# Patient Record
Sex: Female | Born: 1985 | Race: White | Hispanic: Yes | Marital: Married | Smoking: Never smoker
Health system: Southern US, Community
[De-identification: ages and names within clinical notes are randomized; demographics above are authoritative.]

## PROBLEM LIST (undated history)

## (undated) ENCOUNTER — Inpatient Hospital Stay (HOSPITAL_COMMUNITY): Payer: Self-pay

---
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
## 2006-07-10 ENCOUNTER — Ambulatory Visit: Payer: Self-pay | Admitting: Family Medicine

## 2007-02-25 ENCOUNTER — Ambulatory Visit (HOSPITAL_COMMUNITY): Admission: RE | Admit: 2007-02-25 | Discharge: 2007-02-25 | Payer: Self-pay | Admitting: Family Medicine

## 2007-03-19 ENCOUNTER — Ambulatory Visit (HOSPITAL_COMMUNITY): Admission: RE | Admit: 2007-03-19 | Discharge: 2007-03-19 | Payer: Self-pay | Admitting: Obstetrics & Gynecology

## 2007-06-23 ENCOUNTER — Ambulatory Visit: Payer: Self-pay | Admitting: Physician Assistant

## 2007-06-23 ENCOUNTER — Inpatient Hospital Stay (HOSPITAL_COMMUNITY): Admission: AD | Admit: 2007-06-23 | Discharge: 2007-06-25 | Payer: Self-pay | Admitting: Obstetrics & Gynecology

## 2008-01-04 LAB — SICKLE CELL SCREEN: Sickle Cell Screen: NEGATIVE

## 2008-01-04 LAB — OB RESULTS CONSOLE VARICELLA ZOSTER ANTIBODY, IGG: Varicella: IMMUNE

## 2010-06-18 NOTE — Group Therapy Note (Signed)
NAME:  Courtney Franco, Courtney Franco NO.:  [id number]   MEDICAL RECORD NO.:  [id number]          PATIENT TYPE:  WOC   LOCATION:  [facility]                   FACILITY:  [facility]   PHYSICIAN:  Tinnie Gens, MD        DATE OF BIRTH:  03/16/1985   DATE OF SERVICE:                                  CLINIC NOTE   CHIEF COMPLAINT:  Infertility.   HISTORY OF PRESENT ILLNESS:  Patient is a 25 year old G0 who presents to  the [facility] after 2 years of trying unsuccessfully to conceive.  Patient states she and her husband are having intercourse 3 times a  week, but are waiting 2 weeks until after period ends.  Patient states  that prior to January 2008, she was having regular periods every 2  months, and now her periods occur anywhere from 5 to 8 weeks apart.  Her  last menstrual occurred on May 10, 2006.  Patient took a urine  pregnancy test 2 to 3 weeks ago, which was negative.  Patient states  that both of her sisters took a pill to get pregnant.  Patient also  indicates that she has pain in her left side with every period, and was  told by her mother-in-law that she has inflammation.   PAST MEDICAL HISTORY:  Patient is negative for sexually transmitted  infections, PID, or prior pregnancies.  Menstrual history:  Patient  began menstruating at age 49.  She states her period lasts for 3 days.  As discussed above, her cycles occur every 1 to 2 months.  The rest of  her medical history is negative.   FAMILY HISTORY:  Significant for 2 sisters who also had difficulty  conceiving, and significant for diabetes in her grandmother, and  hypertension in her mother.   SOCIAL HISTORY:  Patient lives with her husband, Rolan Bucco, and  has been living in the [place] for the last 7 years.  Prior to  that, she was living in [place].  Patient denies tobacco, alcohol, or  drugs.   PHYSICAL EXAMINATION:  Patient is afebrile.  Vital signs stable.  GENERAL:  Patient is well developed,  well nourished, in no acute  distress.  Alert and cooperative throughout exam.  BIMANUAL EXAM:  Patient has folliculitis secondary to shaving.  Otherwise, normal external genitalia.  Vaginal mucosa is pink and  appropriately moist.  Patient has no cervical motion tenderness.  She  has a small retroverted uterus.  She has no adnexal tenderness, nor  masses appreciated.   ASSESSMENT AND PLAN:  1. Patient is a 25 year old woman with infertility.  Reviewed timing      of ovulation with patient, and instructed her to count the 1st day      of menstrual period as day 1 rather than the last day of her      menstrual period, and that if she counting 2 weeks, she is to count      from the 1st day of her menstrual period.  Patient expressed      understanding of this.  2. Patient was given a prescription for her partner to  have a semen      analysis.  3. Patient was given a basal body temperature chart, and an      explanation of how to use this.  4. Patient will have HSG scheduled following onset of her next      menstrual cycle.  5. Patient was advised to continue intercourse every other day around      the time of ovulation.  6. Patient is to return to the office in 6 to 8 weeks for infertility      followup.           ______________________________  Tinnie Gens, MD     TP/MEDQ  D:  07/10/2006  T:  07/10/2006  Job:  [id number]

## 2010-10-30 LAB — CBC
HCT: 36.3
Platelets: 158
RDW: 14

## 2010-10-30 LAB — RPR: RPR Ser Ql: NONREACTIVE

## 2013-11-04 ENCOUNTER — Ambulatory Visit (INDEPENDENT_AMBULATORY_CARE_PROVIDER_SITE_OTHER): Payer: Self-pay | Admitting: Gynecology

## 2013-11-04 ENCOUNTER — Encounter: Payer: Self-pay | Admitting: Gynecology

## 2013-11-04 VITALS — BP 124/80 | Ht 63.0 in | Wt 159.0 lb

## 2013-11-04 DIAGNOSIS — N915 Oligomenorrhea, unspecified: Secondary | ICD-10-CM | POA: Insufficient documentation

## 2013-11-04 DIAGNOSIS — N970 Female infertility associated with anovulation: Secondary | ICD-10-CM | POA: Insufficient documentation

## 2013-11-04 DIAGNOSIS — E663 Overweight: Secondary | ICD-10-CM

## 2013-11-04 LAB — PREGNANCY, URINE: PREG TEST UR: NEGATIVE

## 2013-11-04 MED ORDER — MEDROXYPROGESTERONE ACETATE 10 MG PO TABS: ORAL_TABLET | ORAL | Status: DC

## 2013-11-04 MED ORDER — CLOMIPHENE CITRATE 50 MG PO TABS: ORAL_TABLET | ORAL | Status: DC

## 2013-11-04 NOTE — Progress Notes (Signed)
   Patient is a 28 year old gravida 1 para 1 who has not been seen in the office in over 6 years. This was the time that patient had seen me because of oligomenorrhea and primary infertility. She conceived on the first round of clomiphene citrate 100 mg from day 5 through 9. The patient denies any visual disturbances or headaches or nipple discharge. Patient stated her last menstrual period was September 2. She started taking prenatal vitamins 2 months ago. She stated that in [facility] where she currently lives had a gynecological exam Pap smear less than a year ago. Patient denies smoking or substance abuse. No past history of sexually transmitted diseases. Patient currently on no medication otherwise healthy. She is slightly overweight with a BMI of 28.17. Patient's last pregnancy was delivered near term vaginally with no complication. Patient did not have any hypertension diabetes or any complications with that pregnancy.  Exam: Abdomen: Soft nontender no rebound or guarding Pelvic: The urethra Skene was within normal limits Vagina: No lesions or discharge Cervix: No lesions or discharge Uterus: Anteverted normal size shape and consistency Adnexa: No palpable mass or tenderness Rectal exam not done  Urine pregnancy test was negative today in the office.  Assessment/plan: Patient with oligomenorrhea and secondary infertility. Patient successfully conceived 6 years ago on clomiphene citrate and timing of intercourse. Patient will be prescribed Provera 10 mg to take 1 by mouth daily for the next 5-10 days to jump start her menses. She will then begin the clomiphene citrate 100 mg from day 5 through 9. She will use the ovulation predictor kit from day 12-16 to time or intercourse. I have given him refills for 3 months. If she does not successfully conceived during that time frame she will contact the office for an appointment. Patient is fully aware that this form of ovulation induction  carries a risk ovarian hyperstimulation and multiple gestation. Her husband was present also during the consultation they fully understand and accept. Instructions were provided in Spanish and ligature information provided as well. All questions answered.

## 2013-11-04 NOTE — Patient Instructions (Addendum)
Tomar una tableta de Provera diaria para que le Chubb Corporationbaje el periodo. No tiene que tomar las diez si le baja el periodo  El primer dia que sangras es el primer dia del ciclo suyo  Del dia 5 al 9 tomar clomid dos tabletas juntas  Del North DakotaDia 12-16 chequiar la orina con Armed forces operational officerel detector de ovulaccion por la manana y por la noche. Si nota el cambio en en indicador tiene Medical illustratorque tener relaccion sexual esa noche y Wells Fargoproxima dos noche  Si no le baja el periodo en 30 dias despues del ultimo periodo Psychiatric nursechequiar prueba de embarazo si esta negativo repetir la secuencia que le describi arriba.  Tomar una vitamina prenatal diario.  La recetas son por 4 meses  Clomiphene tablets Qu es este medicamento? El CLOMIFENO es un medicamento para la fertilidad que se Coc[place] ([place]) Islandsutiliza para aumentar la posibilidad de Burundiquedar embarazada. Se Botswanausa para estimular Hotel manageruna ovulacin (producir un huevo maduro) adecuada durante el ciclo de la [place]mujer. Este medicamento puede ser utilizado para otros usos; si tiene alguna pregunta consulte con su proveedor de atencin mdica o con su farmacutico. MARCAS COMERCIALES DISPONIBLES: Clomid, Serophene Qu le debo informar a mi profesional de la salud antes de tomar este medicamento? Necesita saber si usted presenta alguno de los siguientes problemas o situaciones: -enfermedad de la glndula suprarrenal -enfermedad vascular, trastorno de coagulacin sangunea -quiste en los ovarios -endometriosis -enfermedad heptica -carcinoma de ovario -enfermedad de la glndula pituitaria -sangrado vaginal que no ha sido evaluado -una reaccin alrgica o inusual al clomifeno, a otros medicamentos, alimentos, colorantes o conservantes -si est embarazada (no debe usar si est embarazada) -si est amamantando a un beb Cmo debo utilizar este medicamento? Tome este medicamento por va oral con un vaso de agua. Siga las instrucciones de la etiqueta del South Monroemedicamento. Tomar exactamente segn se indica y [address] el nmero exacto  de [address]  [address]das para los que fue recetado. Tome sus dosis a intervalos regulares. La Harley-Davidsonmayora de las mujeres toman este medicamento durante un perodo de 5 Tonkawadas, Biomedical engineerpero la duracin del tratamiento puede ajustarse en algunos casos. Su mdico le Field seismologistindicarn el da en que debe empezar a tomar este medicamento y Engineer, sitele darn las indicaciones para Animatorel seguimiento. No tome su medicamento con una frecuencia mayor que la indicada. Hable con su pediatra para informarse acerca del uso de este medicamento en nios. Puede requerir atencin especial. Sobredosis: Pngase en contacto inmediatamente con un centro toxicolgico o una sala de urgencia si usted cree que haya tomado demasiado medicamento. ATENCIN: Reynolds AmericanEste medicamento es solo para usted. No comparta este medicamento con nadie. Qu sucede si me olvido de una dosis? Si olvida una dosis, tmela lo antes posible. Si es casi la hora de la prxima dosis, tome slo esa dosis. No tome dosis adicionales o dobles. Qu puede interactuar con este medicamento? -suplementos a base de hierbas o dietticos como cohosh azul, cohosh negro, vitex o DHEA -prasterona Puede ser que esta lista no menciona todas las posibles interacciones. Informe a su profesional de Beazer Homesla salud de Ingram Micro Inctodos los productos a base de hierbas, medicamentos de Farsonventa libre o suplementos nutritivos que est tomando. Si usted fuma, consume bebidas alcohlicas o si utiliza drogas ilegales, indqueselo tambin a su profesional de Beazer Homesla salud. Algunas sustancias pueden interactuar con su medicamento. A qu debo estar atento al usar PPL Corporationeste medicamento? Asegrese que usted sepa cmo y cundo usar PPL Corporationeste medicamento. Debe saber cundo est ovulando y cundo debe Washington Mutualtener relaciones sexuales a fin de incrementar las posibilidades de quedar Halfwayembarazada.  Visite a su mdico o a su profesional de la salud para chequear su evolucin peridicamente. Es posible que deba controlar sus niveles de hormonas en la sangre o que le indique alguna prueba de  orina domiciliaria para Scientist, physiological ovulacin. Trate de no faltar a las citas. En comparacin con otros tratamientos para la fertilidad, este medicamento no aumenta mucho la posibilidad de Warehouse manager un Medical illustrator. Aproximadamente 5 de cada 100 mujeres que toman este medicamento tienen la posibilidad de quedar embarazadas con [employer]. Si piensa que est embarazada deje de tomar este medicamento de inmediato y comunquese con su mdico o con su profesional de Radiographer, therapeutic. Este medicamento no es para tratamientos a Air cabin crew. La Harley-Davidson de las mujeres que se benefician del uso de este medicamento obtienen resultados dentro de los tres primeros ciclos (meses). Su mdico o su profesional de Facilities manager a Financial controller. Este medicamento generalmente se utiliza durante no ms de 6 ciclos de [place]. Puede experimentar mareos o somnolencia. No conduzca ni utilice maquinaria ni haga nada que Scientist, research (life sciences) en estado de alerta hasta que sepa cmo le afecta este medicamento. No se siente ni se ponga de pie con rapidez. Esto reduce el riesgo de mareos o Newell Rubbermaid. El consumir bebidas alcohlicas o el fumar tabaco puede disminuir las posibilidades de [place]. Limitar o dejar de consumir alcohol o el uso de tabaco durante su tratamiento para la fertilidad. Qu efectos secundarios puedo tener al Boston Scientific este medicamento? Efectos secundarios que debe informar a su mdico o a Producer, television/film/video de la salud tan pronto como sea posible: -Therapist, art como erupcin cutnea, picazn o urticarias, hinchazn de la cara, labios o lengua -problemas respiratorios -cambios en la visin -retencin de lquidos -nuseas, vmito -hinchazn o dolor plvico -dolor abdominal severo -aumento de peso repentino Efectos secundarios que, por lo general, no requieren atencin mdica (debe informarlos a su mdico o a su profesional de la salud si persisten o si son molestos): -molestia en las  mamas -sofocos -molestia plvica leve -nuseas leves Puede ser que esta lista no menciona todos los posibles efectos secundarios. Comunquese a su mdico por asesoramiento mdico Hewlett-Packard. Usted puede informar los efectos secundarios a la FDA por telfono al 1-800-FDA-1088. Dnde debo guardar mi medicina? Mantngala fuera del alcance de los nios. Gurdela a Sanmina-SCI, entre 15 y 30 grados C (76 y 75 grados F). Protjala del calor, la luz y la humedad. Deseche todo el medicamento que no haya utilizado, despus de la fecha de vencimiento. ATENCIN: Este folleto es un resumen. Puede ser que no cubra toda la posible informacin. Si usted tiene preguntas acerca de esta medicina, consulte con su mdico, su farmacutico o su profesional de Radiographer, therapeutic.  2015, Elsevier/Gold Standard. (2006-07-07 11:32:00)

## 2013-11-05 LAB — TSH: TSH: 2.23 u[IU]/mL (ref 0.350–4.500)

## 2013-11-05 LAB — PROLACTIN: PROLACTIN: 14 ng/mL

## 2013-12-05 ENCOUNTER — Encounter: Payer: Self-pay | Admitting: Gynecology

## 2014-07-06 DIAGNOSIS — O42919 Preterm premature rupture of membranes, unspecified as to length of time between rupture and onset of labor, unspecified trimester: Secondary | ICD-10-CM

## 2016-02-04 NOTE — L&D Delivery Note (Signed)
Patient is 31 y.o. [id number] 2335w1d admitted for SOL. S/p augmentation with Pitocin. SROM prior to delivery.  Delivery Note At 8:27 AM a viable female was delivered via VBAC, Spontaneous (Presentation: LOA; compound hand).  APGAR: 9, 9; weight pending.   Placenta status: Intact.  Cord: 3V with the following complications: None.  Cord pH: N/A  Anesthesia: Epidural  Episiotomy: None Lacerations: 1st degree Suture Repair: 3.0 vicryl Est. Blood Loss (mL): 100  Head delivered LOA with compound hand. No nuchal cord present. Shoulder and body delivered in usual fashion. Infant with spontaneous cry, placed on mother's abdomen, dried and bulb suctioned. Cord clamped x 2 after 1-minute delay, and cut by family member. Cord blood drawn. Placenta delivered spontaneously with gentle cord traction. Fundus firm with massage and Pitocin. Perineum inspected and found to have laceration, which was repaired with good hemostasis achieved.   Mom to postpartum.  Baby to Couplet care / Skin to Skin.   Caryl AdaJazma Phelps, DO OB Fellow

## 2016-06-18 ENCOUNTER — Encounter: Payer: Self-pay | Admitting: Gynecology

## 2016-07-24 LAB — OB RESULTS CONSOLE PLATELET COUNT: PLATELETS: 190

## 2016-07-24 LAB — CYTOLOGY - PAP: PAP SMEAR: NEGATIVE

## 2016-07-24 LAB — OB RESULTS CONSOLE HEPATITIS B SURFACE ANTIGEN: Hepatitis B Surface Ag: NEGATIVE

## 2016-07-24 LAB — OB RESULTS CONSOLE GC/CHLAMYDIA
CHLAMYDIA, DNA PROBE: NEGATIVE
GC PROBE AMP, GENITAL: NEGATIVE

## 2016-07-24 LAB — OB RESULTS CONSOLE HIV ANTIBODY (ROUTINE TESTING): HIV: NONREACTIVE

## 2016-07-24 LAB — OB RESULTS CONSOLE ABO/RH: RH TYPE: POSITIVE

## 2016-07-24 LAB — OB RESULTS CONSOLE HGB/HCT, BLOOD
HEMATOCRIT: 39
HEMOGLOBIN: 12.5

## 2016-07-24 LAB — OB RESULTS CONSOLE ANTIBODY SCREEN: Antibody Screen: NEGATIVE

## 2016-07-24 LAB — OB RESULTS CONSOLE RUBELLA ANTIBODY, IGM: RUBELLA: IMMUNE

## 2016-07-24 LAB — GLUCOSE TOLERANCE, 1 HOUR: Glucose, 1 Hour GTT: 108

## 2016-07-24 LAB — CYSTIC FIBROSIS DIAGNOSTIC STUDY: Interpretation-CFDNA:: NEGATIVE

## 2016-07-24 LAB — OB RESULTS CONSOLE RPR: RPR: NONREACTIVE

## 2016-08-21 ENCOUNTER — Encounter: Payer: Self-pay | Admitting: *Deleted

## 2016-08-25 ENCOUNTER — Ambulatory Visit (INDEPENDENT_AMBULATORY_CARE_PROVIDER_SITE_OTHER): Payer: Medicaid Other | Admitting: Obstetrics and Gynecology

## 2016-08-25 ENCOUNTER — Encounter: Payer: Self-pay | Admitting: Obstetrics and Gynecology

## 2016-08-25 VITALS — BP 103/53 | HR 83 | Wt 163.9 lb

## 2016-08-25 DIAGNOSIS — Z603 Acculturation difficulty: Secondary | ICD-10-CM | POA: Insufficient documentation

## 2016-08-25 DIAGNOSIS — O09899 Supervision of other high risk pregnancies, unspecified trimester: Secondary | ICD-10-CM

## 2016-08-25 DIAGNOSIS — Z8659 Personal history of other mental and behavioral disorders: Secondary | ICD-10-CM | POA: Insufficient documentation

## 2016-08-25 DIAGNOSIS — O09212 Supervision of pregnancy with history of pre-term labor, second trimester: Secondary | ICD-10-CM

## 2016-08-25 DIAGNOSIS — O9989 Other specified diseases and conditions complicating pregnancy, childbirth and the puerperium: Secondary | ICD-10-CM

## 2016-08-25 DIAGNOSIS — O09219 Supervision of pregnancy with history of pre-term labor, unspecified trimester: Secondary | ICD-10-CM

## 2016-08-25 DIAGNOSIS — Z98891 History of uterine scar from previous surgery: Secondary | ICD-10-CM

## 2016-08-25 DIAGNOSIS — O99891 Other specified diseases and conditions complicating pregnancy: Secondary | ICD-10-CM | POA: Insufficient documentation

## 2016-08-25 DIAGNOSIS — O34219 Maternal care for unspecified type scar from previous cesarean delivery: Secondary | ICD-10-CM

## 2016-08-25 DIAGNOSIS — O0992 Supervision of high risk pregnancy, unspecified, second trimester: Secondary | ICD-10-CM | POA: Diagnosis present

## 2016-08-25 DIAGNOSIS — Z789 Other specified health status: Secondary | ICD-10-CM

## 2016-08-25 LAB — POCT URINALYSIS DIP (DEVICE)
BILIRUBIN URINE: NEGATIVE
GLUCOSE, UA: NEGATIVE mg/dL
Hgb urine dipstick: NEGATIVE
KETONES UR: NEGATIVE mg/dL
NITRITE: NEGATIVE
PH: 6 (ref 5.0–8.0)
PROTEIN: NEGATIVE mg/dL
Specific Gravity, Urine: 1.025 (ref 1.005–1.030)
Urobilinogen, UA: 0.2 mg/dL (ref 0.0–1.0)

## 2016-08-25 NOTE — Progress Notes (Signed)
New OB Note  08/25/2016   Clinic: [facility]  Chief Complaint: transfer of care  Transfer of Care Patient: [facility] for h/o PTB  History of Present Illness: Ms. Dake is a 31 y.o. [id number] @ 20/2 weeks (EDC 12/8, based on Patient's last menstrual period was 04/05/2016.=18wk u/s).  Preg complicated by has Oligomenorrhea; Secondary anovulatory infertility; History of cesarean delivery; History of preterm delivery, currently pregnant; and History of postpartum depression, currently pregnant in second trimester on her problem list.   Any events prior to today's visit: no She was using no method when she conceived and nothing to help conceive She has Negative signs or symptoms of miscarriage or preterm labor  ROS: A 12-point review of systems was performed and negative, except as stated in the above HPI.  OBGYN History: As per HPI. OB History  Gravida Para Term Preterm AB Living  3 2 1 1  0 3  SAB TAB Ectopic Multiple Live Births  0 0 0 1 3    # Outcome Date GA Lbr Len/2nd Weight Sex Delivery Anes PTL Lv  3 Current           2A Preterm 07/06/14 [redacted]w[redacted]d   F CS-Unspec EPI  LIV     Complications: Preterm premature rupture of membranes (PPROM) delivered, current hospitalization  2B Preterm 07/06/14 [redacted]w[redacted]d   F CS-Unspec EPI  LIV     Complications: Preterm premature rupture of membranes (PPROM) delivered, current hospitalization  1 Term 06/23/07 [redacted]w[redacted]d  7 lb 7 oz (3.374 kg) F Vag-Spont  N LIV     Birth Comments: triplet pregnancy, but two embryos "dissolved"      Any issues with any prior pregnancies: yes Prior children are healthy, doing well, and without any problems or issues: yes History of pap smears: Yes. Last pap smear 2018 and results were NILM   Past Medical History: History reviewed. No pertinent past medical history.  Past Surgical History: Past Surgical History:  Procedure Laterality Date  . CESAREAN SECTION      Family History:  Family History   Problem Relation Age of Onset  . Diabetes Maternal Grandmother   . Diabetes Maternal Grandfather   . Diabetes Paternal Grandmother   . Diabetes Paternal Grandfather   . Heart disease Sister    She denies any history of mental retardation, birth defects or genetic disorders in her or the FOB's history  Social History:  Social History   Social History  . Marital status: Married    Spouse name: N/A  . Number of children: N/A  . Years of education: N/A   Occupational History  . Not on file.   Social History Main Topics  . Smoking status: Never Smoker  . Smokeless tobacco: Never Used  . Alcohol use No  . Drug use: Unknown  . Sexual activity: Yes   Other Topics Concern  . Not on file   Social History Narrative  . No narrative on file    Allergy: No Known Allergies  Health Maintenance:  Mammogram Up to Date: not applicable  Current Outpatient Medications: PNV  Physical Exam:   BP (!) 103/53   Pulse 83   Wt 163 lb 14.4 oz (74.3 kg)   LMP 04/05/2016   BMI 29.03 kg/m  Body mass index is 29.03 kg/m. Contractions: Not present Vag. Bleeding: None. Fundal height: not applicable FHTs: 140s  General appearance: Well nourished, well developed female in no acute distress.   Laboratory: See media but all labs for  this point in pregnancy are UTD  Imaging:  7/16: SLIUP, normal anatomy 18/0, [facility]  Assessment: pt doing well  Plan: 1. History of cesarean delivery D/w her that she is a TOLAC candidate. Will d/w her later in pregnancy.   2. History of preterm delivery, currently pregnant Was 34wks with PPROM di-di twins. Given this, no need for any other interventions. Patient had normal TAUS cx length at the HD but did show 4% efw. Will just repeat one here at [facility] with MFM. NTD since far from viability.   3. History of postpartum depression, currently pregnant in second trimester Follow closely.   4. Pregnancy F/u re-draw of  quad screen at HD which was done last week per patient  Interpreter used   Problem list reviewed and updated.  Follow up in 4 weeks.  >50% of 25 min visit spent on counseling and coordination of care.     Cornelia Copaharlie Ruthanna Macchia, Jr. MD Attending [facility](Faculty Practice)

## 2016-08-26 ENCOUNTER — Telehealth: Payer: Self-pay | Admitting: General Practice

## 2016-08-26 NOTE — Telephone Encounter (Signed)
Scheduled appt for 8/9 @ 1015. Called patient with pacific interpreter [phone], no answer- left message to call us back concerning an appt we have scheduled for you. Will send letter

## 2016-08-26 NOTE — Telephone Encounter (Signed)
-----   Message from Watrous Bingharlie Pickens, MD sent at 08/25/2016 10:23 AM EDT ----- Regarding: needs mfm anatomy u/s sometime in next 14d Can you let her know that based final review of u/s that we got from the HD today that I recommend [facility] since [facility] weren't able to see everything on the HD u/s. Thanks!

## 2016-09-11 ENCOUNTER — Ambulatory Visit (HOSPITAL_COMMUNITY)
Admission: RE | Admit: 2016-09-11 | Discharge: 2016-09-11 | Disposition: A | Discharge status: Home / Self Care | Payer: Self-pay | Source: Ambulatory Visit | Attending: Obstetrics and Gynecology | Admitting: Obstetrics and Gynecology

## 2016-09-11 ENCOUNTER — Other Ambulatory Visit: Payer: Self-pay | Admitting: Obstetrics and Gynecology

## 2016-09-11 DIAGNOSIS — O09892 Supervision of other high risk pregnancies, second trimester: Secondary | ICD-10-CM

## 2016-09-11 DIAGNOSIS — O34219 Maternal care for unspecified type scar from previous cesarean delivery: Secondary | ICD-10-CM

## 2016-09-11 DIAGNOSIS — Z3A21 21 weeks gestation of pregnancy: Secondary | ICD-10-CM

## 2016-09-11 DIAGNOSIS — Z3689 Encounter for other specified antenatal screening: Secondary | ICD-10-CM | POA: Insufficient documentation

## 2016-09-11 DIAGNOSIS — O09212 Supervision of pregnancy with history of pre-term labor, second trimester: Secondary | ICD-10-CM

## 2016-09-11 DIAGNOSIS — O0992 Supervision of high risk pregnancy, unspecified, second trimester: Secondary | ICD-10-CM

## 2016-09-16 ENCOUNTER — Encounter: Payer: Self-pay | Admitting: Obstetrics and Gynecology

## 2016-09-16 DIAGNOSIS — O0990 Supervision of high risk pregnancy, unspecified, unspecified trimester: Secondary | ICD-10-CM | POA: Insufficient documentation

## 2016-09-22 ENCOUNTER — Ambulatory Visit (INDEPENDENT_AMBULATORY_CARE_PROVIDER_SITE_OTHER): Payer: Self-pay | Admitting: Family Medicine

## 2016-09-22 ENCOUNTER — Encounter: Payer: Self-pay | Admitting: Family Medicine

## 2016-09-22 VITALS — BP 101/39 | HR 80 | Wt 173.3 lb

## 2016-09-22 DIAGNOSIS — O09899 Supervision of other high risk pregnancies, unspecified trimester: Secondary | ICD-10-CM

## 2016-09-22 DIAGNOSIS — Z3689 Encounter for other specified antenatal screening: Secondary | ICD-10-CM

## 2016-09-22 DIAGNOSIS — Z98891 History of uterine scar from previous surgery: Secondary | ICD-10-CM

## 2016-09-22 DIAGNOSIS — O34219 Maternal care for unspecified type scar from previous cesarean delivery: Secondary | ICD-10-CM

## 2016-09-22 DIAGNOSIS — O9989 Other specified diseases and conditions complicating pregnancy, childbirth and the puerperium: Secondary | ICD-10-CM

## 2016-09-22 DIAGNOSIS — O09219 Supervision of pregnancy with history of pre-term labor, unspecified trimester: Secondary | ICD-10-CM

## 2016-09-22 DIAGNOSIS — O09212 Supervision of pregnancy with history of pre-term labor, second trimester: Secondary | ICD-10-CM

## 2016-09-22 DIAGNOSIS — O0990 Supervision of high risk pregnancy, unspecified, unspecified trimester: Secondary | ICD-10-CM

## 2016-09-22 DIAGNOSIS — Z8659 Personal history of other mental and behavioral disorders: Secondary | ICD-10-CM

## 2016-09-22 DIAGNOSIS — O0992 Supervision of high risk pregnancy, unspecified, second trimester: Secondary | ICD-10-CM

## 2016-09-22 MED ORDER — PRENATAL VITAMINS 0.8 MG PO TABS: 1.0000 | ORAL_TABLET | Freq: Every day | ORAL | 12 refills | Status: DC

## 2016-09-22 NOTE — Progress Notes (Signed)
   PRENATAL VISIT NOTE  Subjective:  Courtney Franco is a 31 y.o. [id number] at [redacted]w[redacted]d with Estimated Date of Delivery: 01/19/17 (by 18-week U/S) being seen today for ongoing prenatal care.  She is currently monitored for the following issues for this high-risk pregnancy and has Secondary anovulatory infertility; History of cesarean delivery; History of preterm delivery, currently pregnant; History of postpartum depression, currently pregnant in second trimester; Language barrier; and Supervision of high risk pregnancy in second trimester on her problem list.  Patient reports no complaints.  Contractions: Not present. Vag. Bleeding: None.  Movement: Present. Denies leaking of fluid.   The following portions of the patient's history were reviewed and updated as appropriate: allergies, current medications, past family history, past medical history, past social history, past surgical history and problem list. Problem list updated.  Objective:   Vitals:   09/22/16 0755  BP: (!) 101/39  Pulse: 80  Weight: 173 lb 4.8 oz (78.6 kg)    Fetal Status: Fetal Heart Rate (bpm): 128 Fundal Height: 23 cm Movement: Present     General:  Alert, oriented and cooperative. Patient is in no acute distress.  Skin: Skin is warm and dry. No rash noted.   Cardiovascular: Normal heart rate noted  Respiratory: Normal respiratory effort, no problems with respiration noted  Abdomen: Soft, gravid, appropriate for gestational age.  Pain/Pressure: Absent     Pelvic: Cervical exam deferred        Extremities: Normal range of motion.  Edema: None  Mental Status:  Normal mood and affect. Normal behavior. Normal judgment and thought content.   Assessment and Plan:  Pregnancy: [id number] at [redacted]w[redacted]d with Estimated Date of Delivery: 01/19/17 by 18-week U/S.  1. Supervision of high risk pregnancy, antepartum - Prenatal Multivit-Min-Fe-FA (PRENATAL VITAMINS) 0.8 MG tablet; Take 1 tablet by mouth daily.  Dispense: 30 tablet;  Refill: 12 - U/S 09/11/16 reviewed. Normal interval anatomy. EFW 27th. Follow-up U/S recommended in 4-6 weeks for growth and confirm EDC   --Scheduled U/S for 10/21/16 - Continue routine PNC   2. History of preterm delivery, currently pregnant:  34-week with PPROM di-di twins - No need for further interventions per Dr. Vergie Living note  4. History of cesarean delivery - Interested in [facility]. Will needs to consent for VBAC later in pregnancy  5. History of postpartum depression, currently pregnant in second trimester - Monitor closely   Preterm labor symptoms and general obstetric precautions including but not limited to vaginal bleeding, contractions, leaking of fluid and fetal movement were reviewed in detail with the patient. Please refer to After Visit Summary for other counseling recommendations.  Return in about 4 weeks (around 10/20/2016) for return OB/GTT.   Raynelle Fanning P. Tyia Binford, MD OB Fellow   Future Appointments Date Time Provider Department Center  10/20/2016 7:40 AM Adam Phenix, MD [facility]  10/21/2016 8:00 AM [facility] [place] 3 [facility]

## 2016-10-20 ENCOUNTER — Ambulatory Visit (INDEPENDENT_AMBULATORY_CARE_PROVIDER_SITE_OTHER): Payer: Self-pay | Admitting: Obstetrics & Gynecology

## 2016-10-20 ENCOUNTER — Encounter: Payer: Self-pay | Admitting: Obstetrics & Gynecology

## 2016-10-20 VITALS — BP 100/63 | HR 81 | Wt 179.4 lb

## 2016-10-20 DIAGNOSIS — O0993 Supervision of high risk pregnancy, unspecified, third trimester: Secondary | ICD-10-CM

## 2016-10-20 DIAGNOSIS — O0990 Supervision of high risk pregnancy, unspecified, unspecified trimester: Secondary | ICD-10-CM

## 2016-10-20 DIAGNOSIS — Z23 Encounter for immunization: Secondary | ICD-10-CM

## 2016-10-20 NOTE — Patient Instructions (Signed)
Third Trimester of Pregnancy The third trimester is from week 28 through week 40 (months 7 through 9). The third trimester is a time when the unborn baby (fetus) is growing rapidly. At the end of the ninth month, the fetus is about 20 inches in length and weighs 6-10 pounds. Body changes during your third trimester Your body will continue to go through many changes during pregnancy. The changes vary from woman to woman. During the third trimester:  Your weight will continue to increase. You can expect to gain 25-35 pounds (11-16 kg) by the end of the pregnancy.  You may begin to get stretch marks on your hips, abdomen, and breasts.  You may urinate more often because the fetus is moving lower into your pelvis and pressing on your bladder.  You may develop or continue to have heartburn. This is caused by increased hormones that slow down muscles in the digestive tract.  You may develop or continue to have constipation because increased hormones slow digestion and cause the muscles that push waste through your intestines to relax.  You may develop hemorrhoids. These are swollen veins (varicose veins) in the rectum that can itch or be painful.  You may develop swollen, bulging veins (varicose veins) in your legs.  You may have increased body aches in the pelvis, back, or thighs. This is due to weight gain and increased hormones that are relaxing your joints.  You may have changes in your hair. These can include thickening of your hair, rapid growth, and changes in texture. Some women also have hair loss during or after pregnancy, or hair that feels dry or thin. Your hair will most likely return to normal after your baby is born.  Your breasts will continue to grow and they will continue to become tender. A yellow fluid (colostrum) may leak from your breasts. This is the first milk you are producing for your baby.  Your belly button may stick out.  You may notice more swelling in your hands,  face, or ankles.  You may have increased tingling or numbness in your hands, arms, and legs. The skin on your belly may also feel numb.  You may feel short of breath because of your expanding uterus.  You may have more problems sleeping. This can be caused by the size of your belly, increased need to urinate, and an increase in your body's metabolism.  You may notice the fetus "dropping," or moving lower in your abdomen (lightening).  You may have increased vaginal discharge.  You may notice your joints feel loose and you may have pain around your pelvic bone.  What to expect at prenatal visits You will have prenatal exams every 2 weeks until week 36. Then you will have weekly prenatal exams. During a routine prenatal visit:  You will be weighed to make sure you and the baby are growing normally.  Your blood pressure will be taken.  Your abdomen will be measured to track your baby's growth.  The fetal heartbeat will be listened to.  Any test results from the previous visit will be discussed.  You may have a cervical check near your due date to see if your cervix has softened or thinned (effaced).  You will be tested for Group B streptococcus. This happens between 35 and 37 weeks.  Your health care provider may ask you:  What your birth plan is.  How you are feeling.  If you are feeling the baby move.  If you have had   any abnormal symptoms, such as leaking fluid, bleeding, severe headaches, or abdominal cramping.  If you are using any tobacco products, including cigarettes, chewing tobacco, and electronic cigarettes.  If you have any questions.  Other tests or screenings that may be performed during your third trimester include:  Blood tests that check for low iron levels (anemia).  Fetal testing to check the health, activity level, and growth of the fetus. Testing is done if you have certain medical conditions or if there are problems during the  pregnancy.  Nonstress test (NST). This test checks the health of your baby to make sure there are no signs of problems, such as the baby not getting enough oxygen. During this test, a belt is placed around your belly. The baby is made to move, and its heart rate is monitored during movement.  What is false labor? False labor is a condition in which you feel small, irregular tightenings of the muscles in the womb (contractions) that usually go away with rest, changing position, or drinking water. These are called Braxton Hicks contractions. Contractions may last for hours, days, or even weeks before true labor sets in. If contractions come at regular intervals, become more frequent, increase in intensity, or become painful, you should see your health care provider. What are the signs of labor?  Abdominal cramps.  Regular contractions that start at 10 minutes apart and become stronger and more frequent with time.  Contractions that start on the top of the uterus and spread down to the lower abdomen and back.  Increased pelvic pressure and dull back pain.  A watery or bloody mucus discharge that comes from the vagina.  Leaking of amniotic fluid. This is also known as your "water breaking." It could be a slow trickle or a gush. Let your health care provider know if it has a color or strange odor. If you have any of these signs, call your health care provider right away, even if it is before your due date. Follow these instructions at home: Medicines  Follow your health care provider's instructions regarding medicine use. Specific medicines may be either safe or unsafe to take during pregnancy.  Take a prenatal vitamin that contains at least 600 micrograms (mcg) of folic acid.  If you develop constipation, try taking a stool softener if your health care provider approves. Eating and drinking  Eat a balanced diet that includes fresh fruits and vegetables, whole grains, good sources of protein  such as meat, eggs, or tofu, and low-fat dairy. Your health care provider will help you determine the amount of weight gain that is right for you.  Avoid raw meat and uncooked cheese. These carry germs that can cause birth defects in the baby.  If you have low calcium intake from food, talk to your health care provider about whether you should take a daily calcium supplement.  Eat four or five small meals rather than three large meals a day.  Limit foods that are high in fat and processed sugars, such as fried and sweet foods.  To prevent constipation: ? Drink enough fluid to keep your urine clear or pale yellow. ? Eat foods that are high in fiber, such as fresh fruits and vegetables, whole grains, and beans. Activity  Exercise only as directed by your health care provider. Most women can continue their usual exercise routine during pregnancy. Try to exercise for 30 minutes at least 5 days a week. Stop exercising if you experience uterine contractions.  Avoid heavy   lifting.  Do not exercise in extreme heat or humidity, or at high altitudes.  Wear low-heel, comfortable shoes.  Practice good posture.  You may continue to have sex unless your health care provider tells you otherwise. Relieving pain and discomfort  Take frequent breaks and rest with your legs elevated if you have leg cramps or low back pain.  Take warm sitz baths to soothe any pain or discomfort caused by hemorrhoids. Use hemorrhoid cream if your health care provider approves.  Wear a good support bra to prevent discomfort from breast tenderness.  If you develop varicose veins: ? Wear support pantyhose or compression stockings as told by your healthcare provider. ? Elevate your feet for 15 minutes, 3-4 times a day. Prenatal care  Write down your questions. Take them to your prenatal visits.  Keep all your prenatal visits as told by your health care provider. This is important. Safety  Wear your seat belt at  all times when driving.  Make a list of emergency phone numbers, including numbers for family, friends, the hospital, and police and fire departments. General instructions  Avoid cat litter boxes and soil used by cats. These carry germs that can cause birth defects in the baby. If you have a cat, ask someone to clean the litter box for you.  Do not travel far distances unless it is absolutely necessary and only with the approval of your health care provider.  Do not use hot tubs, steam rooms, or saunas.  Do not drink alcohol.  Do not use any products that contain nicotine or tobacco, such as cigarettes and e-cigarettes. If you need help quitting, ask your health care provider.  Do not use any medicinal herbs or unprescribed drugs. These chemicals affect the formation and growth of the baby.  Do not douche or use tampons or scented sanitary pads.  Do not cross your legs for long periods of time.  To prepare for the arrival of your baby: ? Take prenatal classes to understand, practice, and ask questions about labor and delivery. ? Make a trial run to the hospital. ? Visit the hospital and tour the maternity area. ? Arrange for maternity or paternity leave through employers. ? Arrange for family and friends to take care of pets while you are in the hospital. ? Purchase a rear-facing car seat and make sure you know how to install it in your car. ? Pack your hospital bag. ? Prepare the baby's nursery. Make sure to remove all pillows and stuffed animals from the baby's crib to prevent suffocation.  Visit your dentist if you have not gone during your pregnancy. Use a soft toothbrush to brush your teeth and be gentle when you floss. Contact a health care provider if:  You are unsure if you are in labor or if your water has broken.  You become dizzy.  You have mild pelvic cramps, pelvic pressure, or nagging pain in your abdominal area.  You have lower back pain.  You have persistent  nausea, vomiting, or diarrhea.  You have an unusual or bad smelling vaginal discharge.  You have pain when you urinate. Get help right away if:  Your water breaks before 37 weeks.  You have regular contractions less than 5 minutes apart before 37 weeks.  You have a fever.  You are leaking fluid from your vagina.  You have spotting or bleeding from your vagina.  You have severe abdominal pain or cramping.  You have rapid weight loss or weight gain.    You have shortness of breath with chest pain.  You notice sudden or extreme swelling of your face, hands, ankles, feet, or legs.  Your baby makes fewer than 10 movements in 2 hours.  You have severe headaches that do not go away when you take medicine.  You have vision changes. Summary  The third trimester is from week 28 through week 40, months 7 through 9. The third trimester is a time when the unborn baby (fetus) is growing rapidly.  During the third trimester, your discomfort may increase as you and your baby continue to gain weight. You may have abdominal, leg, and back pain, sleeping problems, and an increased need to urinate.  During the third trimester your breasts will keep growing and they will continue to become tender. A yellow fluid (colostrum) may leak from your breasts. This is the first milk you are producing for your baby.  False labor is a condition in which you feel small, irregular tightenings of the muscles in the womb (contractions) that eventually go away. These are called Braxton Hicks contractions. Contractions may last for hours, days, or even weeks before true labor sets in.  Signs of labor can include: abdominal cramps; regular contractions that start at 10 minutes apart and become stronger and more frequent with time; watery or bloody mucus discharge that comes from the vagina; increased pelvic pressure and dull back pain; and leaking of amniotic fluid. This information is not intended to replace advice  given to you by your health care provider. Make sure you discuss any questions you have with your health care provider. Document Released: 01/14/2001 Document Revised: 06/28/2015 Document Reviewed: 03/23/2012 Elsevier Interactive Patient Education  2017 [employer]  

## 2016-10-20 NOTE — Progress Notes (Signed)
28 week labs, tdap and flu today

## 2016-10-20 NOTE — Progress Notes (Signed)
   PRENATAL VISIT NOTE  Subjective:  Courtney Franco is a 31 y.o. G3P1103 at [redacted]w[redacted]d being seen today for ongoing prenatal care.  She is currently monitored for the following issues for this high-risk pregnancy and has Secondary anovulatory infertility; History of cesarean delivery; History of preterm delivery, currently pregnant; History of postpartum depression, currently pregnant in second trimester; Language barrier; and Supervision of high risk pregnancy, antepartum on her problem list.  Patient reports no complaints.  Contractions: Irregular. Vag. Bleeding: None.  Movement: Present. Denies leaking of fluid.   The following portions of the patient's history were reviewed and updated as appropriate: allergies, current medications, past family history, past medical history, past social history, past surgical history and problem list. Problem list updated.  Objective:   Vitals:   10/20/16 0816  BP: 100/63  Pulse: 81  Weight: 179 lb 6.4 oz (81.4 kg)    Fetal Status: Fetal Heart Rate (bpm): 128   Movement: Present     General:  Alert, oriented and cooperative. Patient is in no acute distress.  Skin: Skin is warm and dry. No rash noted.   Cardiovascular: Normal heart rate noted  Respiratory: Normal respiratory effort, no problems with respiration noted  Abdomen: Soft, gravid, appropriate for gestational age.  Pain/Pressure: Absent     Pelvic: Cervical exam deferred        Extremities: Normal range of motion.  Edema: None  Mental Status:  Normal mood and affect. Normal behavior. Normal judgment and thought content.   Assessment and Plan:  Pregnancy: G3P1103 at [redacted]w[redacted]d  1. Need for immunization against influenza  - Flu Vaccine QUAD 6+ mos IM (Fluarix)  2. Supervision of high risk pregnancy, antepartum, third trimester Routine third trimester screening - Glucose Tolerance, 2 Hours w/1 Hour - HIV antibody (with reflex) - CBC - RPR  3. Need for Tdap vaccination  - Tdap  vaccine greater than or equal to 7yo IM  4. Supervision of high risk pregnancy, antepartum   Preterm labor symptoms and general obstetric precautions including but not limited to vaginal bleeding, contractions, leaking of fluid and fetal movement were reviewed in detail with the patient. Please refer to After Visit Summary for other counseling recommendations.  Return in about 2 weeks (around 11/03/2016).   Scheryl Darter, MD

## 2016-10-21 ENCOUNTER — Other Ambulatory Visit: Payer: Self-pay | Admitting: Family Medicine

## 2016-10-21 ENCOUNTER — Encounter (HOSPITAL_COMMUNITY): Payer: Self-pay

## 2016-10-21 ENCOUNTER — Ambulatory Visit (HOSPITAL_COMMUNITY)
Admission: RE | Admit: 2016-10-21 | Discharge: 2016-10-21 | Disposition: A | Discharge status: Home / Self Care | Payer: Self-pay | Source: Ambulatory Visit | Attending: Family Medicine | Admitting: Family Medicine

## 2016-10-21 DIAGNOSIS — O09219 Supervision of pregnancy with history of pre-term labor, unspecified trimester: Secondary | ICD-10-CM

## 2016-10-21 DIAGNOSIS — O34219 Maternal care for unspecified type scar from previous cesarean delivery: Secondary | ICD-10-CM | POA: Insufficient documentation

## 2016-10-21 DIAGNOSIS — Z3689 Encounter for other specified antenatal screening: Secondary | ICD-10-CM

## 2016-10-21 DIAGNOSIS — O09899 Supervision of other high risk pregnancies, unspecified trimester: Secondary | ICD-10-CM

## 2016-10-21 DIAGNOSIS — Z3A27 27 weeks gestation of pregnancy: Secondary | ICD-10-CM

## 2016-10-21 DIAGNOSIS — O09212 Supervision of pregnancy with history of pre-term labor, second trimester: Secondary | ICD-10-CM | POA: Insufficient documentation

## 2016-10-21 DIAGNOSIS — Z3687 Encounter for antenatal screening for uncertain dates: Secondary | ICD-10-CM | POA: Insufficient documentation

## 2016-10-21 DIAGNOSIS — O0990 Supervision of high risk pregnancy, unspecified, unspecified trimester: Secondary | ICD-10-CM

## 2016-10-21 LAB — HIV ANTIBODY (ROUTINE TESTING W REFLEX): HIV SCREEN 4TH GENERATION: NONREACTIVE

## 2016-10-21 LAB — CBC
HEMATOCRIT: 36.2 % (ref 34.0–46.6)
HEMOGLOBIN: 12.2 g/dL (ref 11.1–15.9)
MCH: 29.8 pg (ref 26.6–33.0)
MCHC: 33.7 g/dL (ref 31.5–35.7)
MCV: 89 fL (ref 79–97)
Platelets: 182 10*3/uL (ref 150–379)
RBC: 4.09 x10E6/uL (ref 3.77–5.28)
RDW: 14.2 % (ref 12.3–15.4)
WBC: 8.8 10*3/uL (ref 3.4–10.8)

## 2016-10-21 LAB — RPR: RPR Ser Ql: NONREACTIVE

## 2016-10-21 LAB — GLUCOSE TOLERANCE, 2 HOURS W/ 1HR
Glucose, 1 hour: 156 mg/dL (ref 65–179)
Glucose, 2 hour: 131 mg/dL (ref 65–152)
Glucose, Fasting: 80 mg/dL (ref 65–91)

## 2016-10-21 NOTE — Addendum Note (Signed)
Encounter addended by: Marcellina Millin, RTR on: 10/21/2016  8:40 AM<BR>    Actions taken: Imaging Exam ended

## 2016-11-04 ENCOUNTER — Encounter: Payer: Self-pay | Admitting: Obstetrics and Gynecology

## 2016-11-04 ENCOUNTER — Ambulatory Visit (INDEPENDENT_AMBULATORY_CARE_PROVIDER_SITE_OTHER): Payer: Self-pay | Admitting: Obstetrics and Gynecology

## 2016-11-04 VITALS — BP 102/63 | HR 85 | Wt 183.0 lb

## 2016-11-04 DIAGNOSIS — O09219 Supervision of pregnancy with history of pre-term labor, unspecified trimester: Secondary | ICD-10-CM

## 2016-11-04 DIAGNOSIS — O09899 Supervision of other high risk pregnancies, unspecified trimester: Secondary | ICD-10-CM

## 2016-11-04 DIAGNOSIS — O0990 Supervision of high risk pregnancy, unspecified, unspecified trimester: Secondary | ICD-10-CM

## 2016-11-04 DIAGNOSIS — Z789 Other specified health status: Secondary | ICD-10-CM

## 2016-11-04 DIAGNOSIS — O0992 Supervision of high risk pregnancy, unspecified, second trimester: Secondary | ICD-10-CM

## 2016-11-04 DIAGNOSIS — O09212 Supervision of pregnancy with history of pre-term labor, second trimester: Secondary | ICD-10-CM

## 2016-11-04 DIAGNOSIS — Z98891 History of uterine scar from previous surgery: Secondary | ICD-10-CM

## 2016-11-04 NOTE — Progress Notes (Signed)
Prenatal Visit Note Date: 11/04/2016 Clinic: [facility]  Subjective:  Courtney Franco is a 32 y.o. [id number] at [redacted]w[redacted]d being seen today for ongoing prenatal care.  She is currently monitored for the following issues for this high-risk pregnancy and has Secondary anovulatory infertility; History of cesarean delivery; History of preterm delivery, currently pregnant; History of postpartum depression, currently pregnant in second trimester; Language barrier; and Supervision of high risk pregnancy, antepartum on her problem list.  Patient reports no complaints.   Contractions: Irregular. Vag. Bleeding: None.  Movement: Present. Denies leaking of fluid.   The following portions of the patient's history were reviewed and updated as appropriate: allergies, current medications, past family history, past medical history, past social history, past surgical history and problem list. Problem list updated.  Objective:   Vitals:   11/04/16 1556  BP: 102/63  Pulse: 85  Weight: 183 lb (83 kg)    Fetal Status: Fetal Heart Rate (bpm): 130 Fundal Height: 30 cm Movement: Present     General:  Alert, oriented and cooperative. Patient is in no acute distress.  Skin: Skin is warm and dry. No rash noted.   Cardiovascular: Normal heart rate noted  Respiratory: Normal respiratory effort, no problems with respiration noted  Abdomen: Soft, gravid, appropriate for gestational age. Pain/Pressure: Absent     Pelvic:  Cervical exam deferred        Extremities: Normal range of motion.  Edema: Trace  Mental Status: Normal mood and affect. Normal behavior. Normal judgment and thought content.   Urinalysis:      Assessment and Plan:  Pregnancy: G3P1103 at [redacted]w[redacted]d  1. Supervision of high risk pregnancy, antepartum Routine care. D/w pt re: BC nv  2. Language barrier Interpreter used  3. History of preterm delivery, currently pregnant [redacted]wk twin pprom. Not on 17p this pregnancy. Normal CL  on 9/18  4. History of cesarean delivery Desiring tolac. Consent form given to look over. Can d/w pt more nv.   Preterm labor symptoms and general obstetric precautions including but not limited to vaginal bleeding, contractions, leaking of fluid and fetal movement were reviewed in detail with the patient. Please refer to After Visit Summary for other counseling recommendations.  Return in about 2 weeks (around 11/18/2016) for rob.   [place] Bing, MD

## 2016-11-18 ENCOUNTER — Ambulatory Visit (INDEPENDENT_AMBULATORY_CARE_PROVIDER_SITE_OTHER): Payer: Self-pay | Admitting: Family Medicine

## 2016-11-18 ENCOUNTER — Encounter (HOSPITAL_COMMUNITY): Payer: Self-pay | Admitting: *Deleted

## 2016-11-18 ENCOUNTER — Inpatient Hospital Stay (HOSPITAL_COMMUNITY)
Admission: AD | Admit: 2016-11-18 | Discharge: 2016-11-18 | Disposition: A | Discharge status: Home / Self Care | Payer: Self-pay | Source: Ambulatory Visit | Attending: Family Medicine | Admitting: Family Medicine

## 2016-11-18 VITALS — BP 97/61 | HR 90 | Wt 186.6 lb

## 2016-11-18 DIAGNOSIS — Z3A31 31 weeks gestation of pregnancy: Secondary | ICD-10-CM | POA: Insufficient documentation

## 2016-11-18 DIAGNOSIS — O0990 Supervision of high risk pregnancy, unspecified, unspecified trimester: Secondary | ICD-10-CM

## 2016-11-18 DIAGNOSIS — O09899 Supervision of other high risk pregnancies, unspecified trimester: Secondary | ICD-10-CM

## 2016-11-18 DIAGNOSIS — O09219 Supervision of pregnancy with history of pre-term labor, unspecified trimester: Secondary | ICD-10-CM

## 2016-11-18 DIAGNOSIS — Z98891 History of uterine scar from previous surgery: Secondary | ICD-10-CM

## 2016-11-18 DIAGNOSIS — O26893 Other specified pregnancy related conditions, third trimester: Secondary | ICD-10-CM | POA: Insufficient documentation

## 2016-11-18 DIAGNOSIS — L0291 Cutaneous abscess, unspecified: Secondary | ICD-10-CM

## 2016-11-18 DIAGNOSIS — L02415 Cutaneous abscess of right lower limb: Secondary | ICD-10-CM

## 2016-11-18 DIAGNOSIS — O9989 Other specified diseases and conditions complicating pregnancy, childbirth and the puerperium: Secondary | ICD-10-CM

## 2016-11-18 MED ORDER — PRENATAL VITAMINS 0.8 MG PO TABS: 1.0000 | ORAL_TABLET | Freq: Every day | ORAL | 3 refills | Status: DC

## 2016-11-18 MED ORDER — CEPHALEXIN 250 MG PO CAPS: 500.0000 mg | ORAL_CAPSULE | Freq: Three times a day (TID) | ORAL | 0 refills | Status: DC

## 2016-11-18 MED ORDER — SULFAMETHOXAZOLE-TRIMETHOPRIM 800-160 MG PO TABS: 1.0000 | ORAL_TABLET | Freq: Two times a day (BID) | ORAL | 0 refills | Status: AC

## 2016-11-18 MED ORDER — FENTANYL CITRATE (PF) 100 MCG/2ML IJ SOLN
50.0000 ug | Freq: Once | INTRAMUSCULAR | Status: AC
  Administered 2016-11-18: 50 ug via INTRAVENOUS
  Filled 2016-11-18: qty 2

## 2016-11-18 NOTE — MAU Note (Signed)
Sent up from clinic after rtn prenatal appt.  Pt has a big pimple, inflamed and red on inner thigh of rt leg.

## 2016-11-18 NOTE — MAU Note (Addendum)
Area on rt inner thigh is approx 9cm, red, edematous. Saline lock placed.

## 2016-11-18 NOTE — MAU Provider Note (Signed)
History     CSN: [id number]  Arrival date and time: 11/18/16 1754   First Provider Initiated Contact with Patient 11/18/16 1916      Chief Complaint  Patient presents with  . Recurrent Skin Infections   HPI  31 y.o. [id number] with IUP at [redacted]w[redacted]d who present to MAU d/t abscess on right thigh. She reports she first noted lesion about 4 days ago, and it has been getting bigger, and more painful. Reports it has become difficult to walk d/t pain of lesion rubbing in between her legs. She has noticed her redness, but no drainage. Report she had a similar lesion years ago, but on a different site, and this had to be drained. Denies fever, chills, nausea, vomiting, malaise, fatigue, dizziness or lightheadedness. Also denies decrease in fetal movement, vaginal bleeding, discharge, LOF, or regular contractions.   OB History    Gravida Para Term Preterm AB Living   0 3   SAB TAB Ectopic Multiple Live Births   0 0 0 1 3      Obstetric Comments   After c/s, legs were very swollen- delayed return of feeling and function to legs for 3 days. (per pt)      History reviewed. No pertinent past medical history.  Past Surgical History:  Procedure Laterality Date  . CESAREAN SECTION      Family History  Problem Relation Age of Onset  . Diabetes Maternal Grandmother   . Diabetes Maternal Grandfather   . Diabetes Paternal Grandmother   . Diabetes Paternal Grandfather   . Cancer Paternal Grandfather        pancreatic  . Heart disease Daughter        2 small holes in her heart, both have closed    Social History  Substance Use Topics  . Smoking status: Never Smoker  . Smokeless tobacco: Never Used  . Alcohol use No    Allergies: No Known Allergies  Prescriptions Prior to Admission  Medication Sig Dispense Refill Last Dose  . [DISCONTINUED] Prenatal Multivit-Min-Fe-FA (PRENATAL VITAMINS) 0.8 MG tablet Take 1 tablet by mouth daily. 30 tablet 12 Taking    Review of Systems   Constitutional: Negative for chills, diaphoresis, fatigue and fever.  HENT: Negative.   Respiratory: Negative for cough and shortness of breath.   Cardiovascular: Negative.   Gastrointestinal: Negative for abdominal pain, nausea and vomiting.  Genitourinary: Negative.   Musculoskeletal: Negative.  Negative for arthralgias.  Neurological: Negative for dizziness.   Physical Exam   Blood pressure 104/63, pulse 82, temperature 98.2 F (36.8 C), temperature source Oral, resp. rate 16, weight 187 lb (84.8 kg), last menstrual period 04/05/2016, SpO2 99 %.  Physical Exam  Vitals reviewed. Constitutional: She is oriented to person, place, and time. She appears well-developed and well-nourished. No distress.  HENT:  Head: Normocephalic and atraumatic.  Mouth/Throat: Oropharynx is clear and moist.  Eyes: Conjunctivae and EOM are normal. No scleral icterus.  Neck: Normal range of motion. Neck supple.  Cardiovascular: Normal rate and intact distal pulses.   Respiratory: Effort normal.  GI: Soft.  Musculoskeletal: Normal range of motion.  Neurological: She is alert and oriented to person, place, and time.  Skin: Skin is warm and dry.  4-5cm fluctuant abscess on right upper medial thigh with some overlying erythema; some mild erythema below abscess as well.    MAU Course  Procedures I&D of abscess R medial thigh. IV fentanyl given prior to procedure. Local lidocaine used. Incision  made with scalpel, and moderate amount of purulent drainage expressed. Wound culture obtained. Abscess cavity then probed with hemostats. Wound was note packed. Patient tolerated procedure well without complications.  MDM I&D of abscess as above. Oral antibiotics prescribed given surrounding cellulitis. Instructions given on daily dressing change.   Assessment and Plan  Skin abscess --S/p I&D. Wound check in 1 week --Rx: bactrim to cover for MRSA --Dressing change instructions given --Discussed return  precautions.  Courtney Franco 11/18/2016, 8:22 PM

## 2016-11-18 NOTE — Progress Notes (Signed)
   PRENATAL VISIT NOTE  Subjective:  Courtney Franco is a 31 y.o. [id number] at [redacted]w[redacted]d being seen today for ongoing prenatal care.  She is currently monitored for the following issues for this high-risk pregnancy and has Secondary anovulatory infertility; History of cesarean delivery; History of preterm delivery, currently pregnant; History of postpartum depression, currently pregnant in second trimester; Language barrier; and Supervision of high risk pregnancy, antepartum on her problem list.  Patient reports c/o "pimple" on R thigh, getting bigger. Denies fever, chills.  Contractions: Irregular. Vag. Bleeding: None.  Movement: Present. Denies leaking of fluid.   The following portions of the patient's history were reviewed and updated as appropriate: allergies, current medications, past family history, past medical history, past social history, past surgical history and problem list. Problem list updated.  Objective:   Vitals:   11/18/16 1713  Weight: 186 lb 9.6 oz (84.6 kg)    Fetal Status: Fetal Heart Rate (bpm): 127   Movement: Present     General:  Alert, oriented and cooperative. Patient is in no acute distress.  Skin: Skin is warm and dry. 4-5cm fluctuant lesion on right upper inner thigh with overlying erythema with no drainage; some erythema on skin below lesion as well.  Cardiovascular: Normal heart rate noted  Respiratory: Normal respiratory effort, no problems with respiration noted  Abdomen: Soft, gravid, appropriate for gestational age.  Pain/Pressure: Absent     Pelvic: Cervical exam deferred        Extremities: Normal range of motion.  Edema: Trace  Mental Status:  Normal mood and affect. Normal behavior. Normal judgment and thought content.   Assessment and Plan:  Pregnancy: G3P1103 at [redacted]w[redacted]d  1. Supervision of high risk pregnancy, antepartum Routine care  2. History of preterm delivery, currently pregnant At [redacted]wks for PPROM. Not on 17-P Normal CL on  9/18  3.  History of Cesarean delivery Considering TOLAC - Need to sign consent next visit   4. Abscess: on right medial thigh - Discussed need for I&D, but patient hesitant to do so d/t pain. Dicussed oral antibiotics given concern for surrounding cellulitis, and warm compress to abscess site as alternative to I&D and if not draining on it's own in the next 24-48 hours, would still need I&D; but I highly recommend I&D today given findings.  - Patient decided to go home initially with conservative tx and return precautions, but later returned to [facility]U later tonight for I&D (see MAU note). - Bactrim x 7 days  Preterm labor symptoms and general obstetric precautions including but not limited to vaginal bleeding, contractions, leaking of fluid and fetal movement were reviewed in detail with the patient. Please refer to After Visit Summary for other counseling recommendations.  Return in about 2 weeks (around 12/02/2016) for [facility].   Frederik Pear, MD   Future Appointments Date Time Provider Department Center  12/03/2016 3:40 PM Noralee Chars [facility]  12/17/2016 7:40 AM Katrinka Blazing, [place], CNM [facility]  12/23/2016 7:40 AM Degele, Kandra Nicolas, MD [facility]  12/30/2016 7:40 AM Hermina Staggers, MD [facility]

## 2016-11-18 NOTE — Discharge Instructions (Signed)
Incisión y drenaje, cuidados posteriores  (Incision and Drainage, Care After)  Siga estas instrucciones durante las próximas semanas. Estas indicaciones le proporcionan información acerca de cómo deberá cuidarse después del procedimiento. El médico también podrá darle instrucciones más específicas. El tratamiento ha sido planificado según las prácticas médicas actuales, pero en algunos casos pueden ocurrir problemas. Comuníquese con el médico si tiene algún problema o dudas después del procedimiento.  QUÉ ESPERAR DESPUÉS DEL PROCEDIMIENTO  Después del procedimiento, es común tener los siguientes síntomas:  · Dolor o molestias alrededor del lugar de la incisión.  · Secreción que proviene de la incisión.  INSTRUCCIONES PARA EL CUIDADO EN EL HOGAR  · Tome los medicamentos de venta libre y los recetados solamente como se lo haya indicado el médico.  · Si le recetaron un antibiótico, tómelo como se lo haya indicado el médico. No deje de tomar los antibióticos aunque comience a sentirse mejor.  · Siga las indicaciones del médico acerca de lo siguiente:  ? Cómo cuidar la incisión.  ? Cómo y cuándo cambiar los apósitos y las vendas (vendaje). Lávese las manos con agua y jabón antes de cambiar el vendaje. Use desinfectante para manos si no dispone de agua y jabón.  ? Cuándo retirar el vendaje.  · No tome baños de inmersión, no nade ni use el jacuzzi hasta que el médico lo autorice.  · Concurra a todas las visitas de control como se lo haya indicado el médico. Esto es importante.  · Controle todos los días la zona de la incisión para detectar signos de infección. Esté atento a los siguientes signos:  ? Aumento del enrojecimiento, la hinchazón o el dolor.  ? Más líquido o sangre.  ? Calor.  ? Pus o mal olor.    SOLICITE ATENCIÓN MÉDICA SI:  · Le vuelve a aparecer el quiste o el absceso.  · Tiene fiebre.  · Aumentan el enrojecimiento, la hinchazón o el dolor alrededor de la incisión.  · Le sale más líquido o sangre de la  incisión.  · La incisión está caliente al tacto.  · Tiene pus o percibe que sale mal olor del lugar de la incisión.    SOLICITE ATENCIÓN MÉDICA DE INMEDIATO SI:  · Tiene dolor intenso o sangrado.  · No puede comer ni beber sin vomitar.  · Tiene menor volumen de orina.  · Comienza a sentir falta de aire.  · Siente dolor en el pecho.  · Tose y escupe sangre.  · Siente entumecimiento u hormigueo en la zona donde le realizaron la incisión y el drenaje.    Esta información no tiene como fin reemplazar el consejo del médico. Asegúrese de hacerle al médico cualquier pregunta que tenga.  Document Released: 07/22/2011 Document Revised: 05/14/2015 Document Reviewed: 11/10/2014  Elsevier Interactive Patient Education © 2017 [employer]

## 2016-11-20 LAB — AEROBIC CULTURE W GRAM STAIN (SUPERFICIAL SPECIMEN): Culture: NORMAL

## 2016-11-20 LAB — AEROBIC CULTURE  (SUPERFICIAL SPECIMEN)

## 2016-12-03 ENCOUNTER — Encounter: Payer: Self-pay | Admitting: Family Medicine

## 2016-12-07 LAB — OB RESULTS CONSOLE GBS: GBS: NEGATIVE

## 2016-12-17 ENCOUNTER — Ambulatory Visit (INDEPENDENT_AMBULATORY_CARE_PROVIDER_SITE_OTHER): Payer: Self-pay | Admitting: Advanced Practice Midwife

## 2016-12-17 VITALS — BP 118/69 | HR 89 | Wt 194.6 lb

## 2016-12-17 DIAGNOSIS — O0990 Supervision of high risk pregnancy, unspecified, unspecified trimester: Secondary | ICD-10-CM

## 2016-12-17 DIAGNOSIS — Z98891 History of uterine scar from previous surgery: Secondary | ICD-10-CM

## 2016-12-17 DIAGNOSIS — Z113 Encounter for screening for infections with a predominantly sexual mode of transmission: Secondary | ICD-10-CM

## 2016-12-17 DIAGNOSIS — O0993 Supervision of high risk pregnancy, unspecified, third trimester: Secondary | ICD-10-CM

## 2016-12-17 DIAGNOSIS — Z3483 Encounter for supervision of other normal pregnancy, third trimester: Secondary | ICD-10-CM

## 2016-12-17 NOTE — Progress Notes (Signed)
Routine cultures today

## 2016-12-17 NOTE — Progress Notes (Signed)
   PRENATAL VISIT NOTE  Subjective:  Courtney Franco is a 31 y.o. [id number] at 6140w2d being seen today for ongoing prenatal care.  She is currently monitored for the following issues for this high-risk pregnancy and has Secondary anovulatory infertility; History of cesarean delivery; History of preterm delivery, currently pregnant; History of postpartum depression, currently pregnant in second trimester; Language barrier; and Supervision of high risk pregnancy, antepartum on their problem list.  Patient reports no complaints.  Contractions: Irregular. Vag. Bleeding: None.  Movement: Present. Denies leaking of fluid.   The following portions of the patient's history were reviewed and updated as appropriate: allergies, current medications, past family history, past medical history, past social history, past surgical history and problem list. Problem list updated.  Objective:   Vitals:   12/17/16 0824  BP: 118/69  Pulse: 89  Weight: 194 lb 9.6 oz (88.3 kg)    Fetal Status: Fetal Heart Rate (bpm): 131 Fundal Height: 35 cm Movement: Present  Presentation: Vertex  General:  Alert, oriented and cooperative. Patient is in no acute distress.  Skin: Skin is warm and dry. No rash noted.   Cardiovascular: Normal heart rate noted  Respiratory: Normal respiratory effort, no problems with respiration noted  Abdomen: Soft, gravid, appropriate for gestational age.  Pain/Pressure: Absent     Pelvic: Cervical exam performed Dilation: Closed Effacement (%): 0 Station: -3  Extremities: Normal range of motion.  Edema: Mild pitting, slight indentation  Mental Status:  Normal mood and affect. Normal behavior. Normal judgment and thought content.   Assessment and Plan:  Pregnancy: G3P1103 at 5640w2d  1. Encounter for supervision of other normal pregnancy in third trimester  - Strep Gp B NAA - Cervicovaginal ancillary only  2. Supervision of high risk pregnancy, antepartum  3. Previous C/S - TOLAC  vs C/S discussed. Consent signed.   Preterm labor symptoms and general obstetric precautions including but not limited to vaginal bleeding, contractions, leaking of fluid and fetal movement were reviewed in detail with the patient. Please refer to After Visit Summary for other counseling recommendations.  Return for [facility].   Dorathy KinsmanVirginia Doyce Stonehouse, CNM

## 2016-12-18 LAB — CERVICOVAGINAL ANCILLARY ONLY
Chlamydia: NEGATIVE
Neisseria Gonorrhea: NEGATIVE

## 2016-12-18 NOTE — Progress Notes (Signed)
   PRENATAL VISIT NOTE  Subjective:  Courtney Franco is a 31 y.o. [id number] at 169w1d being seen today for ongoing prenatal care.  She is currently monitored for the following issues for this high-risk pregnancy and has Secondary anovulatory infertility; History of cesarean delivery; History of preterm delivery, currently pregnant; History of postpartum depression, currently pregnant in second trimester; and Supervision of high risk pregnancy, antepartum on their problem list.  Patient reports no complaints.  Contractions: Irregular. Vag. Bleeding: None.  Movement: Present. Denies leaking of fluid.   The following portions of the patient's history were reviewed and updated as appropriate: allergies, current medications, past family history, past medical history, past social history, past surgical history and problem list. Problem list updated.  Objective:   Vitals:   12/23/16 0756  BP: 100/66  Pulse: 80  Weight: 194 lb 14.4 oz (88.4 kg)    Fetal Status: Fetal Heart Rate (bpm): 130   Movement: Present     General:  Alert, oriented and cooperative. Patient is in no acute distress.  Skin: Skin is warm and dry. 4-5cm fluctuant lesion on right upper inner thigh with overlying erythema with no drainage; some erythema on skin below lesion as well.  Cardiovascular: Normal heart rate noted  Respiratory: Normal respiratory effort, no problems with respiration noted  Abdomen: Soft, gravid, appropriate for gestational age.  Pain/Pressure: Present     Pelvic: Cervical exam deferred        Extremities: Normal range of motion.  Edema: Trace  Mental Status:  Normal mood and affect. Normal behavior. Normal judgment and thought content.   Assessment and Plan:  Pregnancy: G3P1103 at 1626w1d  1. Supervision of high risk pregnancy, antepartum Routine care Culture done last week, reviewed. GBS neg  2. History of preterm delivery, currently pregnant At [redacted]wks for PPROM of di-di twins. Not on  17-P Normal CL on 9/18  3.  History of Cesarean delivery - TOLAC consent signed 12/17/16   Preterm labor symptoms and general obstetric precautions including but not limited to vaginal bleeding, contractions, leaking of fluid and fetal movement were reviewed in detail with the patient. Please refer to After Visit Summary for other counseling recommendations.  [facility]around [facility]2016[facility]  Frederik PearJulie P Degele, MD   Future Appointments  Date Time Provider Department Center  12/30/2016  7:40 AM Hermina StaggersErvin, Michael L, [facility]

## 2016-12-19 LAB — STREP GP B NAA: STREP GROUP B AG: NEGATIVE

## 2016-12-23 ENCOUNTER — Ambulatory Visit (INDEPENDENT_AMBULATORY_CARE_PROVIDER_SITE_OTHER): Payer: Self-pay | Admitting: Family Medicine

## 2016-12-23 VITALS — BP 100/66 | HR 80 | Wt 194.9 lb

## 2016-12-23 DIAGNOSIS — O09219 Supervision of pregnancy with history of pre-term labor, unspecified trimester: Secondary | ICD-10-CM

## 2016-12-23 DIAGNOSIS — O09899 Supervision of other high risk pregnancies, unspecified trimester: Secondary | ICD-10-CM

## 2016-12-23 DIAGNOSIS — Z98891 History of uterine scar from previous surgery: Secondary | ICD-10-CM

## 2016-12-23 DIAGNOSIS — O0990 Supervision of high risk pregnancy, unspecified, unspecified trimester: Secondary | ICD-10-CM

## 2016-12-23 DIAGNOSIS — Z8659 Personal history of other mental and behavioral disorders: Secondary | ICD-10-CM

## 2016-12-23 DIAGNOSIS — O9989 Other specified diseases and conditions complicating pregnancy, childbirth and the puerperium: Secondary | ICD-10-CM

## 2016-12-23 MED ORDER — PRENATAL VITAMINS 0.8 MG PO TABS: 1.0000 | ORAL_TABLET | Freq: Every day | ORAL | 3 refills | Status: DC

## 2016-12-30 ENCOUNTER — Encounter: Payer: Self-pay | Admitting: Obstetrics and Gynecology

## 2016-12-30 ENCOUNTER — Ambulatory Visit (INDEPENDENT_AMBULATORY_CARE_PROVIDER_SITE_OTHER): Payer: Self-pay | Admitting: Obstetrics and Gynecology

## 2016-12-30 VITALS — BP 114/73 | HR 83 | Wt 195.1 lb

## 2016-12-30 DIAGNOSIS — O0990 Supervision of high risk pregnancy, unspecified, unspecified trimester: Secondary | ICD-10-CM

## 2016-12-30 DIAGNOSIS — O09899 Supervision of other high risk pregnancies, unspecified trimester: Secondary | ICD-10-CM

## 2016-12-30 DIAGNOSIS — Z98891 History of uterine scar from previous surgery: Secondary | ICD-10-CM

## 2016-12-30 DIAGNOSIS — O09219 Supervision of pregnancy with history of pre-term labor, unspecified trimester: Secondary | ICD-10-CM

## 2016-12-30 LAB — POCT URINALYSIS DIP (DEVICE)
Bilirubin Urine: NEGATIVE
Glucose, UA: NEGATIVE mg/dL
HGB URINE DIPSTICK: NEGATIVE
Ketones, ur: NEGATIVE mg/dL
NITRITE: NEGATIVE
PH: 6.5 (ref 5.0–8.0)
Protein, ur: NEGATIVE mg/dL
SPECIFIC GRAVITY, URINE: 1.015 (ref 1.005–1.030)
UROBILINOGEN UA: 0.2 mg/dL (ref 0.0–1.0)

## 2016-12-30 NOTE — Progress Notes (Signed)
Subjective:  Courtney Franco is a 31 y.o. [id number] at 5766w1d being seen today for ongoing prenatal care.  She is currently monitored for the following issues for this high-risk pregnancy and has History of cesarean delivery; History of preterm delivery, currently pregnant; History of postpartum depression, currently pregnant in second trimester; and Supervision of high risk pregnancy, antepartum on their problem list.  Patient reports no complaints.  Contractions: Irregular. Vag. Bleeding: None.  Movement: Present. Denies leaking of fluid.   The following portions of the patient's history were reviewed and updated as appropriate: allergies, current medications, past family history, past medical history, past social history, past surgical history and problem list. Problem list updated.  Objective:   Vitals:   12/30/16 0748  BP: 114/73  Pulse: 83  Weight: 195 lb 1.6 oz (88.5 kg)    Fetal Status: Fetal Heart Rate (bpm): 133   Movement: Present     General:  Alert, oriented and cooperative. Patient is in no acute distress.  Skin: Skin is warm and dry. No rash noted.   Cardiovascular: Normal heart rate noted  Respiratory: Normal respiratory effort, no problems with respiration noted  Abdomen: Soft, gravid, appropriate for gestational age. Pain/Pressure: Present     Pelvic:  Cervical exam deferred        Extremities: Normal range of motion.  Edema: Trace  Mental Status: Normal mood and affect. Normal behavior. Normal judgment and thought content.   Urinalysis:      Assessment and Plan:  Pregnancy: G3P1103 at 6566w1d  1. Supervision of high risk pregnancy, antepartum Stable Labor precautions  2. History of preterm delivery, currently pregnant   3. History of cesarean delivery TOLAC consent signed  Term labor symptoms and general obstetric precautions including but not limited to vaginal bleeding, contractions, leaking of fluid and fetal movement were reviewed in detail with the  patient. Please refer to After Visit Summary for other counseling recommendations.  Return in about 1 week (around 01/06/2017) for OB visit.   Hermina StaggersErvin, Julizza Sassone L, MD

## 2016-12-30 NOTE — Patient Instructions (Addendum)
Vaginal Delivery Vaginal delivery means that you will give birth by pushing your baby out of your birth canal (vagina). A team of health care providers will help you before, during, and after vaginal delivery. Birth experiences are unique for every woman and every pregnancy, and birth experiences vary depending on where you choose to give birth. What should I do to prepare for my baby's birth? Before your baby is born, it is important to talk with your health care provider about:  Your labor and delivery preferences. These may include: ? Medicines that you may be given. ? How you will manage your pain. This might include non-medical pain relief techniques or injectable pain relief such as epidural analgesia. ? How you and your baby will be monitored during labor and delivery. ? Who may be in the labor and delivery room with you. ? Your feelings about surgical delivery of your baby (cesarean delivery, or C-section) if this becomes necessary. ? Your feelings about receiving donated blood through an IV tube (blood transfusion) if this becomes necessary.  Whether you are able: ? To take pictures or videos of the birth. ? To eat during labor and delivery. ? To move around, walk, or change positions during labor and delivery.  What to expect after your baby is born, such as: ? Whether delayed umbilical cord clamping and cutting is offered. ? Who will care for your baby right after birth. ? Medicines or tests that may be recommended for your baby. ? Whether breastfeeding is supported in your hospital or birth center. ? How long you will be in the hospital or birth center.  How any medical conditions you have may affect your baby or your labor and delivery experience.  To prepare for your baby's birth, you should also:  Attend all of your health care visits before delivery (prenatal visits) as recommended by your health care provider. This is important.  Prepare your home for your baby's  arrival. Make sure that you have: ? Diapers. ? Baby clothing. ? Feeding equipment. ? Safe sleeping arrangements for you and your baby.  Install a car seat in your vehicle. Have your car seat checked by a certified car seat installer to make sure that it is installed safely.  Think about who will help you with your new baby at home for at least the first several weeks after delivery.  What can I expect when I arrive at the birth center or hospital? Once you are in labor and have been admitted into the hospital or birth center, your health care provider may:  Review your pregnancy history and any concerns you have.  Insert an IV tube into one of your veins. This is used to give you fluids and medicines.  Check your blood pressure, pulse, temperature, and heart rate (vital signs).  Check whether your bag of water (amniotic sac) has broken (ruptured).  Talk with you about your birth plan and discuss pain control options.  Monitoring Your health care provider may monitor your contractions (uterine monitoring) and your baby's heart rate (fetal monitoring). You may need to be monitored:  Often, but not continuously (intermittently).  All the time or for long periods at a time (continuously). Continuous monitoring may be needed if: ? You are taking certain medicines, such as medicine to relieve pain or make your contractions stronger. ? You have pregnancy or labor complications.  Monitoring may be done by:  Placing a special stethoscope or a handheld monitoring device on your abdomen to   check your baby's heartbeat, and feeling your abdomen for contractions. This method of monitoring does not continuously record your baby's heartbeat or your contractions.  Placing monitors on your abdomen (external monitors) to record your baby's heartbeat and the frequency and length of contractions. You may not have to wear external monitors all the time.  Placing monitors inside of your uterus  (internal monitors) to record your baby's heartbeat and the frequency, length, and strength of your contractions. ? Your health care provider may use internal monitors if he or she needs more information about the strength of your contractions or your baby's heart rate. ? Internal monitors are put in place by passing a thin, flexible wire through your vagina and into your uterus. Depending on the type of monitor, it may remain in your uterus or on your baby's head until birth. ? Your health care provider will discuss the benefits and risks of internal monitoring with you and will ask for your permission before inserting the monitors.  Telemetry. This is a type of continuous monitoring that can be done with external or internal monitors. Instead of having to stay in bed, you are able to move around during telemetry. Ask your health care provider if telemetry is an option for you.  Physical exam Your health care provider may perform a physical exam. This may include:  Checking whether your baby is positioned: ? With the head toward your vagina (head-down). This is most common. ? With the head toward the top of your uterus (head-up or breech). If your baby is in a breech position, your health care provider may try to turn your baby to a head-down position so you can deliver vaginally. If it does not seem that your baby can be born vaginally, your provider may recommend surgery to deliver your baby. In rare cases, you may be able to deliver vaginally if your baby is head-up (breech delivery). ? Lying sideways (transverse). Babies that are lying sideways cannot be delivered vaginally.  Checking your cervix to determine: ? Whether it is thinning out (effacing). ? Whether it is opening up (dilating). ? How low your baby has moved into your birth canal.  What are the three stages of labor and delivery?  Normal labor and delivery is divided into the following three stages: Stage 1  Stage 1 is the  longest stage of labor, and it can last for hours or days. Stage 1 includes: ? Early labor. This is when contractions may be irregular, or regular and mild. Generally, early labor contractions are more than 10 minutes apart. ? Active labor. This is when contractions get longer, more regular, more frequent, and more intense. ? The transition phase. This is when contractions happen very close together, are very intense, and may last longer than during any other part of labor.  Contractions generally feel mild, infrequent, and irregular at first. They get stronger, more frequent (about every 2-3 minutes), and more regular as you progress from early labor through active labor and transition.  Many women progress through stage 1 naturally, but you may need help to continue making progress. If this happens, your health care provider may talk with you about: ? Rupturing your amniotic sac if it has not ruptured yet. ? Giving you medicine to help make your contractions stronger and more frequent.  Stage 1 ends when your cervix is completely dilated to 4 inches (10 cm) and completely effaced. This happens at the end of the transition phase. Stage 2  Once   your cervix is completely effaced and dilated to 4 inches (10 cm), you may start to feel an urge to push. It is common for the body to naturally take a rest before feeling the urge to push, especially if you received an epidural or certain other pain medicines. This rest period may last for up to 1-2 hours, depending on your unique labor experience.  During stage 2, contractions are generally less painful, because pushing helps relieve contraction pain. Instead of contraction pain, you may feel stretching and burning pain, especially when the widest part of your baby's head passes through the vaginal opening (crowning).  Your health care provider will closely monitor your pushing progress and your baby's progress through the vagina during stage 2.  Your  health care provider may massage the area of skin between your vaginal opening and anus (perineum) or apply warm compresses to your perineum. This helps it stretch as the baby's head starts to crown, which can help prevent perineal tearing. ? In some cases, an incision may be made in your perineum (episiotomy) to allow the baby to pass through the vaginal opening. An episiotomy helps to make the opening of the vagina larger to allow more room for the baby to fit through.  It is very important to breathe and focus so your health care provider can control the delivery of your baby's head. Your health care provider may have you decrease the intensity of your pushing, to help prevent perineal tearing.  After delivery of your baby's head, the shoulders and the rest of the body generally deliver very quickly and without difficulty.  Once your baby is delivered, the umbilical cord may be cut right away, or this may be delayed for 1-2 minutes, depending on your baby's health. This may vary among health care providers, hospitals, and birth centers.  If you and your baby are healthy enough, your baby may be placed on your chest or abdomen to help maintain the baby's temperature and to help you bond with each other. Some mothers and babies start breastfeeding at this time. Your health care team will dry your baby and help keep your baby warm during this time.  Your baby may need immediate care if he or she: ? Showed signs of distress during labor. ? Has a medical condition. ? Was born too early (prematurely). ? Had a bowel movement before birth (meconium). ? Shows signs of difficulty transitioning from being inside the uterus to being outside of the uterus. If you are planning to breastfeed, your health care team will help you begin a feeding. Stage 3  The third stage of labor starts immediately after the birth of your baby and ends after you deliver the placenta. The placenta is an organ that develops  during pregnancy to provide oxygen and nutrients to your baby in the womb.  Delivering the placenta may require some pushing, and you may have mild contractions. Breastfeeding can stimulate contractions to help you deliver the placenta.  After the placenta is delivered, your uterus should tighten (contract) and become firm. This helps to stop bleeding in your uterus. To help your uterus contract and to control bleeding, your health care provider may: ? Give you medicine by injection, through an IV tube, by mouth, or through your rectum (rectally). ? Massage your abdomen or perform a vaginal exam to remove any blood clots that are left in your uterus. ? Empty your bladder by placing a thin, flexible tube (catheter) into your bladder. ? Encourage   you to breastfeed your baby. After labor is over, you and your baby will be monitored closely to ensure that you are both healthy until you are ready to go home. Your health care team will teach you how to care for yourself and your baby. This information is not intended to replace advice given to you by your health care provider. Make sure you discuss any questions you have with your health care provider. Document Released: 10/30/2007 Document Revised: 08/10/2015 Document Reviewed: 02/04/2015 Elsevier Interactive Patient Education  2018 [employer]  

## 2017-01-09 ENCOUNTER — Ambulatory Visit (INDEPENDENT_AMBULATORY_CARE_PROVIDER_SITE_OTHER): Payer: Self-pay | Admitting: Obstetrics & Gynecology

## 2017-01-09 ENCOUNTER — Encounter: Payer: Self-pay | Admitting: Obstetrics & Gynecology

## 2017-01-09 VITALS — BP 126/81 | HR 81 | Wt 202.3 lb

## 2017-01-09 DIAGNOSIS — O0990 Supervision of high risk pregnancy, unspecified, unspecified trimester: Secondary | ICD-10-CM

## 2017-01-09 DIAGNOSIS — Z98891 History of uterine scar from previous surgery: Secondary | ICD-10-CM

## 2017-01-09 DIAGNOSIS — O09219 Supervision of pregnancy with history of pre-term labor, unspecified trimester: Secondary | ICD-10-CM

## 2017-01-09 DIAGNOSIS — O0993 Supervision of high risk pregnancy, unspecified, third trimester: Secondary | ICD-10-CM

## 2017-01-09 DIAGNOSIS — O09899 Supervision of other high risk pregnancies, unspecified trimester: Secondary | ICD-10-CM

## 2017-01-09 NOTE — Patient Instructions (Signed)
Return to clinic for any scheduled appointments or obstetric concerns, or go to MAU for evaluation  

## 2017-01-09 NOTE — Progress Notes (Signed)
Denies headache or visual disturbances. 7lb weight gain noted.

## 2017-01-09 NOTE — Progress Notes (Signed)
   PRENATAL VISIT NOTE  Subjective:  Courtney Franco is a 31 y.o. [id number] at 5477w4d being seen today for ongoing prenatal care.  She is currently monitored for the following issues for this high-risk pregnancy and has History of cesarean delivery; History of preterm delivery, currently pregnant; History of postpartum depression, currently pregnant in second trimester; and Supervision of high risk pregnancy, antepartum on their problem list.  Patient reports no complaints.  Contractions: Irregular. Vag. Bleeding: None.  Movement: Present. Denies leaking of fluid.   The following portions of the patient's history were reviewed and updated as appropriate: allergies, current medications, past family history, past medical history, past social history, past surgical history and problem list. Problem list updated.  Objective:   Vitals:   01/09/17 0832  BP: 126/81  Pulse: 81  Weight: 202 lb 4.8 oz (91.8 kg)    Fetal Status: Fetal Heart Rate (bpm): 134 Fundal Height: 39 cm Movement: Present  Presentation: Vertex  General:  Alert, oriented and cooperative. Patient is in no acute distress.  Skin: Skin is warm and dry. No rash noted.   Cardiovascular: Normal heart rate noted  Respiratory: Normal respiratory effort, no problems with respiration noted  Abdomen: Soft, gravid, appropriate for gestational age.  Pain/Pressure: Present     Pelvic: Cervical exam performed Dilation: Fingertip Effacement (%): 40 Station: Ballotable  Extremities: Normal range of motion.  Edema: Mild pitting, slight indentation  Mental Status:  Normal mood and affect. Normal behavior. Normal judgment and thought content.   Assessment and Plan:  Pregnancy: G3P1103 at 5277w4d  1. History of preterm delivery, currently pregnant Already term, yay!!  2. History of cesarean delivery Desires TOLAC  3. Supervision of high risk pregnancy, antepartum Term labor symptoms and general obstetric precautions including but not  limited to vaginal bleeding, contractions, leaking of fluid and fetal movement were reviewed in detail with the patient. Please refer to After Visit Summary for other counseling recommendations.  Return in about 1 week (around 01/16/2017) for OB Visit (HOB).   Jaynie CollinsUgonna Latarshia Jersey, MD

## 2017-01-12 ENCOUNTER — Encounter (HOSPITAL_COMMUNITY): Payer: Self-pay | Admitting: *Deleted

## 2017-01-12 ENCOUNTER — Inpatient Hospital Stay (HOSPITAL_COMMUNITY)
Admission: AD | Admit: 2017-01-12 | Discharge: 2017-01-14 | DRG: 807 | Disposition: A | Discharge status: Home / Self Care | Payer: Medicaid Other | Source: Ambulatory Visit | Attending: Obstetrics & Gynecology | Admitting: Obstetrics & Gynecology

## 2017-01-12 DIAGNOSIS — O34219 Maternal care for unspecified type scar from previous cesarean delivery: Principal | ICD-10-CM | POA: Diagnosis present

## 2017-01-12 DIAGNOSIS — O99214 Obesity complicating childbirth: Secondary | ICD-10-CM | POA: Diagnosis present

## 2017-01-12 DIAGNOSIS — Z3A39 39 weeks gestation of pregnancy: Secondary | ICD-10-CM

## 2017-01-12 NOTE — MAU Note (Signed)
Pt has a boil on her right inner thigh. Pt states it hurts worse than ctx

## 2017-01-12 NOTE — MAU Note (Signed)
Pt presents to MAU c/o ctxs every 10-4715mins. Pt states they became more frequent around 1900 this evening. Pt reports good FM and denies Vaginal bleeding or LOF.

## 2017-01-13 ENCOUNTER — Inpatient Hospital Stay (HOSPITAL_COMMUNITY): Payer: Medicaid Other | Admitting: Anesthesiology

## 2017-01-13 ENCOUNTER — Encounter (HOSPITAL_COMMUNITY): Payer: Self-pay

## 2017-01-13 ENCOUNTER — Other Ambulatory Visit: Payer: Self-pay

## 2017-01-13 DIAGNOSIS — O34219 Maternal care for unspecified type scar from previous cesarean delivery: Secondary | ICD-10-CM

## 2017-01-13 DIAGNOSIS — O99214 Obesity complicating childbirth: Secondary | ICD-10-CM | POA: Diagnosis present

## 2017-01-13 DIAGNOSIS — Z3483 Encounter for supervision of other normal pregnancy, third trimester: Secondary | ICD-10-CM | POA: Diagnosis present

## 2017-01-13 DIAGNOSIS — Z3A39 39 weeks gestation of pregnancy: Secondary | ICD-10-CM

## 2017-01-13 LAB — CBC
HCT: 40.3 % (ref 36.0–46.0)
Hemoglobin: 13.3 g/dL (ref 12.0–15.0)
MCH: 29.8 pg (ref 26.0–34.0)
MCHC: 33 g/dL (ref 30.0–36.0)
MCV: 90.2 fL (ref 78.0–100.0)
PLATELETS: 144 10*3/uL — AB (ref 150–400)
RBC: 4.47 MIL/uL (ref 3.87–5.11)
RDW: 14.1 % (ref 11.5–15.5)
WBC: 9.6 10*3/uL (ref 4.0–10.5)

## 2017-01-13 LAB — RPR: RPR Ser Ql: NONREACTIVE

## 2017-01-13 LAB — ABO/RH: ABO/RH(D): B POS

## 2017-01-13 LAB — TYPE AND SCREEN
ABO/RH(D): B POS
Antibody Screen: NEGATIVE

## 2017-01-13 MED ORDER — PHENYLEPHRINE 40 MCG/ML (10ML) SYRINGE FOR IV PUSH (FOR BLOOD PRESSURE SUPPORT)
80.0000 ug | PREFILLED_SYRINGE | INTRAVENOUS | Status: DC | PRN
  Filled 2017-01-13: qty 5

## 2017-01-13 MED ORDER — EPHEDRINE 5 MG/ML INJ
10.0000 mg | INTRAVENOUS | Status: DC | PRN
  Filled 2017-01-13: qty 2

## 2017-01-13 MED ORDER — DIPHENHYDRAMINE HCL 25 MG PO CAPS: 25.0000 mg | ORAL_CAPSULE | Freq: Four times a day (QID) | ORAL | Status: DC | PRN

## 2017-01-13 MED ORDER — LIDOCAINE HCL (PF) 1 % IJ SOLN
30.0000 mL | INTRAMUSCULAR | Status: DC | PRN
  Filled 2017-01-13: qty 30

## 2017-01-13 MED ORDER — EPHEDRINE 5 MG/ML INJ
10.0000 mg | INTRAVENOUS | Status: DC | PRN
Start: 2017-01-13 — End: 2017-01-13
  Filled 2017-01-13: qty 2

## 2017-01-13 MED ORDER — ACETAMINOPHEN 325 MG PO TABS
650.0000 mg | ORAL_TABLET | ORAL | Status: DC | PRN
Start: 2017-01-13 — End: 2017-01-13

## 2017-01-13 MED ORDER — IBUPROFEN 600 MG PO TABS
600.0000 mg | ORAL_TABLET | Freq: Four times a day (QID) | ORAL | Status: DC
  Administered 2017-01-13 – 2017-01-14 (×6): 600 mg via ORAL
  Filled 2017-01-13 (×6): qty 1

## 2017-01-13 MED ORDER — LIDOCAINE HCL (PF) 1 % IJ SOLN
INTRAMUSCULAR | Status: DC | PRN
  Administered 2017-01-13 (×2): 6 mL via EPIDURAL

## 2017-01-13 MED ORDER — COCONUT OIL OIL: 1.0000 "application " | TOPICAL_OIL | Status: DC | PRN

## 2017-01-13 MED ORDER — ONDANSETRON HCL 4 MG PO TABS: 4.0000 mg | ORAL_TABLET | ORAL | Status: DC | PRN

## 2017-01-13 MED ORDER — WITCH HAZEL-GLYCERIN EX PADS: 1.0000 "application " | MEDICATED_PAD | CUTANEOUS | Status: DC | PRN

## 2017-01-13 MED ORDER — OXYTOCIN BOLUS FROM INFUSION
500.0000 mL | Freq: Once | INTRAVENOUS | Status: AC
  Administered 2017-01-13: 500 mL via INTRAVENOUS

## 2017-01-13 MED ORDER — OXYCODONE-ACETAMINOPHEN 5-325 MG PO TABS: 1.0000 | ORAL_TABLET | ORAL | Status: DC | PRN

## 2017-01-13 MED ORDER — LACTATED RINGERS IV SOLN: 500.0000 mL | Freq: Once | INTRAVENOUS | Status: DC

## 2017-01-13 MED ORDER — SIMETHICONE 80 MG PO CHEW: 80.0000 mg | CHEWABLE_TABLET | ORAL | Status: DC | PRN

## 2017-01-13 MED ORDER — FLEET ENEMA 7-19 GM/118ML RE ENEM: 1.0000 | ENEMA | RECTAL | Status: DC | PRN

## 2017-01-13 MED ORDER — PHENYLEPHRINE 40 MCG/ML (10ML) SYRINGE FOR IV PUSH (FOR BLOOD PRESSURE SUPPORT)
80.0000 ug | PREFILLED_SYRINGE | INTRAVENOUS | Status: DC | PRN
  Filled 2017-01-13: qty 10
  Filled 2017-01-13: qty 5

## 2017-01-13 MED ORDER — BENZOCAINE-MENTHOL 20-0.5 % EX AERO
1.0000 "application " | INHALATION_SPRAY | CUTANEOUS | Status: DC | PRN
  Administered 2017-01-13: 1 via TOPICAL
  Filled 2017-01-13: qty 56

## 2017-01-13 MED ORDER — DIBUCAINE 1 % RE OINT: 1.0000 "application " | TOPICAL_OINTMENT | RECTAL | Status: DC | PRN

## 2017-01-13 MED ORDER — OXYTOCIN 40 UNITS IN LACTATED RINGERS INFUSION - SIMPLE MED
2.5000 [IU]/h | INTRAVENOUS | Status: DC
  Filled 2017-01-13: qty 1000

## 2017-01-13 MED ORDER — LACTATED RINGERS IV SOLN: INTRAVENOUS | Status: DC

## 2017-01-13 MED ORDER — PRENATAL MULTIVITAMIN CH
1.0000 | ORAL_TABLET | Freq: Every day | ORAL | Status: DC
  Administered 2017-01-14: 1 via ORAL
  Filled 2017-01-13 (×2): qty 1

## 2017-01-13 MED ORDER — DIPHENHYDRAMINE HCL 50 MG/ML IJ SOLN: 12.5000 mg | INTRAMUSCULAR | Status: DC | PRN

## 2017-01-13 MED ORDER — TERBUTALINE SULFATE 1 MG/ML IJ SOLN
0.2500 mg | Freq: Once | INTRAMUSCULAR | Status: DC | PRN
Start: 2017-01-13 — End: 2017-01-13
  Filled 2017-01-13: qty 1

## 2017-01-13 MED ORDER — ONDANSETRON HCL 4 MG/2ML IJ SOLN: 4.0000 mg | INTRAMUSCULAR | Status: DC | PRN

## 2017-01-13 MED ORDER — SENNOSIDES-DOCUSATE SODIUM 8.6-50 MG PO TABS
2.0000 | ORAL_TABLET | ORAL | Status: DC
  Administered 2017-01-13: 2 via ORAL
  Filled 2017-01-13: qty 2

## 2017-01-13 MED ORDER — ACETAMINOPHEN 325 MG PO TABS
650.0000 mg | ORAL_TABLET | ORAL | Status: DC | PRN
  Administered 2017-01-14: 650 mg via ORAL
  Filled 2017-01-13: qty 2

## 2017-01-13 MED ORDER — OXYCODONE-ACETAMINOPHEN 5-325 MG PO TABS: 2.0000 | ORAL_TABLET | ORAL | Status: DC | PRN

## 2017-01-13 MED ORDER — ONDANSETRON HCL 4 MG/2ML IJ SOLN: 4.0000 mg | Freq: Four times a day (QID) | INTRAMUSCULAR | Status: DC | PRN

## 2017-01-13 MED ORDER — FENTANYL CITRATE (PF) 100 MCG/2ML IJ SOLN: 100.0000 ug | INTRAMUSCULAR | Status: DC | PRN

## 2017-01-13 MED ORDER — OXYTOCIN 40 UNITS IN LACTATED RINGERS INFUSION - SIMPLE MED
1.0000 m[IU]/min | INTRAVENOUS | Status: DC
  Administered 2017-01-13: 2 m[IU]/min via INTRAVENOUS

## 2017-01-13 MED ORDER — SOD CITRATE-CITRIC ACID 500-334 MG/5ML PO SOLN: 30.0000 mL | ORAL | Status: DC | PRN

## 2017-01-13 MED ORDER — ZOLPIDEM TARTRATE 5 MG PO TABS: 5.0000 mg | ORAL_TABLET | Freq: Every evening | ORAL | Status: DC | PRN

## 2017-01-13 MED ORDER — TETANUS-DIPHTH-ACELL PERTUSSIS 5-2.5-18.5 LF-MCG/0.5 IM SUSP: 0.5000 mL | Freq: Once | INTRAMUSCULAR | Status: DC

## 2017-01-13 MED ORDER — LACTATED RINGERS IV SOLN: 500.0000 mL | INTRAVENOUS | Status: DC | PRN

## 2017-01-13 MED ORDER — FENTANYL 2.5 MCG/ML BUPIVACAINE 1/10 % EPIDURAL INFUSION (WH - ANES)
14.0000 mL/h | INTRAMUSCULAR | Status: DC | PRN
  Administered 2017-01-13: 12 mL/h via EPIDURAL
  Filled 2017-01-13: qty 100

## 2017-01-13 NOTE — H&P (Signed)
OBSTETRIC ADMISSION HISTORY AND PHYSICAL  Courtney Franco is a 31 y.o. female [phone] with IUP at 7763w1d by 18 week u/s presenting for spontaneous onset of labor. She reports +FMs, No LOF, no VB, no blurry vision, headaches or peripheral edema, and RUQ pain.  She plans on breast feeding. She is unsure of what she desires for birth control. [facility]   She is a TOLAC, previous c/s for twins with malpresentation at 34 weeks after PPROM  Dating: By 18 week u/s --->  Estimated Date of Delivery: 01/19/17  Clinic  WOC ([facility] transfer) Prenatal Labs  Dating  [facility]  [facility] ) Blood type: B/Positive/-- (06/21 0000) B pos  Genetic Screen Quad: Negative Antibody:Negative (06/21 0000)neg  Anatomic US  09/11/16: complete anatomy normal; female; posterior placenta; marginal cord insertion Rubella: Immune (06/21 0000)imm  GDM screening Early (1hr):  Neg 2 hr in 3rd trimester 24-28wks:Results for Courtney ColeGARCIA-SANCHEZ, Shulamis (MRN [id number]) as of 10/21/2016 11:33  Ref. Range 10/20/2016 08:56  Glucose, 1 hour Latest Ref Range: 65 - 179 mg/dL 528156  Glucose, Fasting Latest Ref Range: 65 - 91 mg/dL 80  Glucose, 2 hour Latest Ref Range: 65 - 152 mg/dL 413131   RPR: Nonreactive (06/21 0000) neg  Flu vaccine  10/20/16 HBsAg: Negative (06/21 0000) neg  TDaP vaccine  10/20/16  RHogam: n/a HIV: Non-reactive (06/21 0000) neg  Baby Food Breast                           GBS: Negative  Contraception Undecided Pap: neg 2018  Circumcision No   Pediatrician [place]    Support Person Husband, Mom     Prenatal History/Complications:  Past Medical History: No past medical history on file.  Past Surgical History: Past Surgical History:  Procedure Laterality Date  . CESAREAN SECTION      Obstetrical History: OB History    Gravida Para Term Preterm AB Living   3 2 1 1  0 3   SAB TAB Ectopic Multiple Live Births   0 0 0 1 3      Obstetric Comments   After c/s, legs were very swollen-  delayed return of feeling and function to legs for 3 days. (per pt)      Social History: Social History   Socioeconomic History  . Marital status: Married    Spouse name: None  . Number of children: None  . Years of education: None  . Highest education level: None  Social Needs  . Financial resource strain: None  . Food insecurity - worry: None  . Food insecurity - inability: None  . Transportation needs - medical: None  . Transportation needs - non-medical: None  Occupational History  . None  Tobacco Use  . Smoking status: Never Smoker  . Smokeless tobacco: Never Used  Substance and Sexual Activity  . Alcohol use: No  . Drug use: No  . Sexual activity: Yes  Other Topics Concern  . None  Social History Narrative  . None    Family History: Family History  Problem Relation Age of Onset  . Diabetes Maternal Grandmother   . Diabetes Maternal Grandfather   . Diabetes Paternal Grandmother   . Diabetes Paternal Grandfather   . Cancer Paternal Grandfather        pancreatic  . Heart disease Daughter        2 small holes in her heart, both have closed  Allergies: No Known Allergies  Medications Prior to Admission  Medication Sig Dispense Refill Last Dose  . Prenatal Multivit-Min-Fe-FA (PRENATAL VITAMINS) 0.8 MG tablet Take 1 tablet daily by mouth. 90 tablet 3 01/11/2017 at Unknown time     Review of Systems   All systems reviewed and negative except as stated in HPI  Blood pressure 127/84, pulse 69, temperature 97.9 F (36.6 C), temperature source Oral, resp. rate 18, last menstrual period 04/05/2016. General appearance: alert, cooperative and no distress Lungs: clear to auscultation bilaterally Heart: regular rate and rhythm Abdomen: soft, non-tender; bowel sounds normal Pelvic: n/a Extremities: Homans sign is negative, no sign of DVT DTR's +2 Presentation: cephalic Fetal monitoringBaseline: 120 bpm, Variability: Good {> 6 bpm), Accelerations: Reactive  and Decelerations: Absent Uterine activityFrequency: Every 5-7 minutes Dilation: 5.5 Effacement (%): 80 Station: -2 Exam by:: danielle simpson rn    Prenatal labs: ABO, Rh: B/Positive/-- (06/21 0000) Antibody: Negative (06/21 0000) Rubella: Immune (06/21 0000) RPR: Non Reactive (09/17 0856)  HBsAg: Negative (06/21 0000)  HIV: Non-reactive (06/21 0000)  GBS: Negative (11/14 0916)   Prenatal Transfer Tool  Maternal Diabetes: No Genetic Screening: Normal Maternal Ultrasounds/Referrals: Normal Fetal Ultrasounds or other Referrals:  None Maternal Substance Abuse:  No Significant Maternal Medications:  None Significant Maternal Lab Results: Lab values include: Group B Strep negative  No results found for this or any previous visit (from the past 24 hour(s)).  Patient Active Problem List   Diagnosis Date Noted  . Supervision of high risk pregnancy, antepartum 09/16/2016  . History of cesarean delivery 08/25/2016  . History of preterm delivery, currently pregnant 08/25/2016  . History of postpartum depression, currently pregnant in second trimester 08/25/2016    Assessment/Plan:  Courtney Franco is a 31 y.o. [id number] at 4166w1d here for spontaneous onset of labor  #Labor: Expectant management #Pain: Plans IV pain medication if needed #FWB: Cat 1 #ID:  GBS neg #MOF: Breast #MOC: Unsure, options discussed #Circ:  No  Rolm BookbinderCaroline M Jannat Rosemeyer, CNM  01/13/2017, 2:23 AM

## 2017-01-13 NOTE — Anesthesia Postprocedure Evaluation (Signed)
Anesthesia Post Note  Patient: Courtney Franco  Procedure(s) Performed: AN AD HOC LABOR EPIDURAL     Patient location during evaluation: Mother Baby Anesthesia Type: Epidural Level of consciousness: awake Pain management: satisfactory to patient Vital Signs Assessment: post-procedure vital signs reviewed and stable Respiratory status: spontaneous breathing Cardiovascular status: stable Anesthetic complications: no    Last Vitals:  Vitals:   01/13/17 1158 01/13/17 1615  BP: (!) 101/58 115/64  Pulse: 73 73  Resp: 18 18  Temp: 36.9 C 36.8 C  SpO2:      Last Pain:  Vitals:   01/13/17 1615  TempSrc: Oral  PainSc:    Pain Goal: Patients Stated Pain Goal: 5 (01/12/17 2324)               Cephus ShellingBURGER,Caeli Linehan

## 2017-01-13 NOTE — Anesthesia Pain Management Evaluation Note (Signed)
  CRNA Pain Management Visit Note  Patient: Courtney Franco, 31 y.o., female  "Hello [facility]. We have an anesthesia team available at all times to provide care throughout the hospital, including epidural management and anesthesia for C-section. I don't know your plan for the delivery whether it a natural birth, water birth, IV sedation, nitrous supplementation, doula or epidural, but we want to meet your pain goals."   1.Was your pain managed to your expectations on prior hospitalizations?   Yes   2.What is your expectation for pain management during this hospitalization?     Epidural  3.How can we help you reach that goal? Epidural in place  Record the patient's initial score and the patient's pain goal.   Pain: 4  Pain Goal: 5 [facility] wants you to be able to say your pain was always managed very well.  Russ Looper 01/13/2017

## 2017-01-13 NOTE — Progress Notes (Signed)
Labor Progress Note Luanna ColeMaricela Garcia-Sanchez is a 31 y.o. [id number] at 3762w1d presented for spontaneous onset of labor  S:  Patient feeling more constant pressure, wanting to "do something to make labor happen faster."  O:  BP 113/67   Pulse (!) 58   Temp 98 F (36.7 C) (Oral)   Resp 18   Ht 5\' 3"  (1.6 m)   Wt 202 lb (91.6 kg)   LMP 04/05/2016   SpO2 99%   BMI 35.78 kg/m   Fetal Tracing:  Baseline: 130 Variability: moderate Accels: 15x15 Decels: none  Toco: 2-5   CVE: Dilation: 8 Effacement (%): 80 Cervical Position: Middle Station: 0 Presentation: Vertex Exam by:: Cleone Slimaroline Kourtnie Sachs, CNM   A&P: 31 y.o. [id number] 2562w1d spontaneous onset of labor #Labor: Patient requesting augmentation. Discussed risks and benefits of AROM again. Patient declines and requests pitocin. Will start pitocin 2x2 #Pain: epidural #FWB: Cat 1 #GBS negative  Rolm Bookbinderaroline M Edia Pursifull, CNM 7:31 AM

## 2017-01-13 NOTE — Progress Notes (Signed)
Labor Progress Note Courtney ColeMaricela Franco is a 31 y.o. [id number] at 4596w1d presented for spontaneous onset of labor  S:  Patient comfortable with epidural, reporting intermittent pressure with contractions  O:  BP 117/79   Pulse 71   Temp 98 F (36.7 C) (Oral)   Resp 18   Ht 5\' 3"  (1.6 m)   Wt 202 lb (91.6 kg)   LMP 04/05/2016   SpO2 99%   BMI 35.78 kg/m   Fetal Tracing:  Baseline: 130 Variability: moderate Accels: 15x15 Decels: variable  Toco: 2-3  CVE: Dilation: 8 Effacement (%): 80 Cervical Position: Middle Station: 0 Presentation: Vertex Exam by:: Cleone Slimaroline Neill, CNM   A&P: 31 y.o. [id number] 7496w1d spontaneous onset of labor #Labor: Progressing well. Variables with contractions. Discussed with patient risks and benefits of AROM for labor augmentation, patient declines. Will continue expectant managment #Pain: epidural #FWB: Cat 1 #GBS negative  Rolm Bookbinderaroline M Neill, CNM 5:32 AM

## 2017-01-13 NOTE — Anesthesia Procedure Notes (Signed)
Epidural  Start time: 01/13/2017 3:20 AM End time: 01/13/2017 3:53 AM  Staffing Anesthesiologist: Jairo BenJackson, Kamuela Magos, MD Performed: anesthesiologist   Preanesthetic Checklist Completed: patient identified, surgical consent, pre-op evaluation, timeout performed, IV checked, risks and benefits discussed and monitors and equipment checked  Epidural Patient position: sitting Prep: site prepped and draped and DuraPrep Patient monitoring: blood pressure, continuous pulse ox and heart rate Approach: midline Location: L2-L3 Injection technique: LOR air  Needle:  Needle type: Tuohy  Needle gauge: 17 G Needle length: 9 cm Needle insertion depth: 5 cm Catheter type: closed end flexible Catheter size: 19 Gauge Catheter at skin depth: 11 cm Test dose: negative (1% lidocaine)  Assessment Events: blood not aspirated, injection not painful, no injection resistance, negative IV test and no paresthesia  Additional Notes Pt identified in Labor room.  Monitors applied. Working IV access confirmed. Sterile prep, drape lumbar spine.  1% lido local L 2,3.  #17ga Touhy LOR air at 5 cm L 2,3, cath in easily to 11 cm skin. Test dose OK, cath dosed and infusion begun.  Patient asymptomatic, VSS, no heme aspirated, tolerated well.  Courtney Franco, MDReason for block:procedure for pain

## 2017-01-13 NOTE — Anesthesia Preprocedure Evaluation (Addendum)
Anesthesia Evaluation  Patient identified by MRN, date of birth, ID band Patient awake    Reviewed: Allergy & Precautions, NPO status , Patient's Chart, lab work & pertinent test results  History of Anesthesia Complications Negative for: history of anesthetic complications  Airway Mallampati: III  TM Distance: >3 FB Neck ROM: Full    Dental  (+) Dental Advisory Given   Pulmonary neg pulmonary ROS,    breath sounds clear to auscultation       Cardiovascular negative cardio ROS   Rhythm:Regular Rate:Normal     Neuro/Psych negative neurological ROS     GI/Hepatic negative GI ROS, Neg liver ROS,   Endo/Other  Morbid obesity  Renal/GU negative Renal ROS     Musculoskeletal   Abdominal (+) + obese,   Peds  Hematology negative hematology ROS (+) plt 144k   Anesthesia Other Findings   Reproductive/Obstetrics (+) Pregnancy Previous C-section                            Anesthesia Physical Anesthesia Plan  ASA: II  Anesthesia Plan: Epidural   Post-op Pain Management:    Induction:   PONV Risk Score and Plan: Treatment may vary due to age or medical condition  Airway Management Planned: Natural Airway  Additional Equipment:   Intra-op Plan:   Post-operative Plan:   Informed Consent: I have reviewed the patients History and Physical, chart, labs and discussed the procedure including the risks, benefits and alternatives for the proposed anesthesia with the patient or authorized representative who has indicated his/her understanding and acceptance.   Dental advisory given  Plan Discussed with:   Anesthesia Plan Comments: (Patient identified. Risks/Benefits/Options discussed with patient including but not limited to bleeding, infection, nerve damage, paralysis, failed block, incomplete pain control, headache, blood pressure changes, nausea, vomiting, reactions to medication both or  allergic, itching and postpartum back pain. Confirmed with bedside nurse the patient's most recent platelet count. Confirmed with patient that they are not currently taking any anticoagulation, have any bleeding history or any family history of bleeding disorders. Patient expressed understanding and wished to proceed. All questions were answered. )        Anesthesia Quick Evaluation

## 2017-01-14 MED ORDER — IBUPROFEN 600 MG PO TABS: 600.0000 mg | ORAL_TABLET | Freq: Four times a day (QID) | ORAL | 0 refills | Status: AC | PRN

## 2017-01-14 NOTE — Discharge Summary (Signed)
OB Discharge Summary     Patient Name: Courtney Franco DOB: 05/19/1985 MRN: [id number]  Date of admission: 01/12/2017 Delivering MD: Pincus LargePHELPS, JAZMA Y   Date of discharge: 01/14/2017  Admitting diagnosis: 39WKS CTX PAIN AND BLEEDING Intrauterine pregnancy: 2375w1d     Secondary diagnosis:  Active Problems:   Normal labor and delivery   VBAC (vaginal birth after Cesarean)  Additional problems: hx PPD     Discharge diagnosis: Term Pregnancy Delivered and VBAC                                                                                                Post partum procedures:none  Augmentation: Pitocin  Complications: None  Hospital course:  Onset of Labor With Vaginal Delivery     31 y.o. yo [id number] at 2575w1d was admitted in Active Labor on 01/12/2017. Patient had an uncomplicated labor course as follows:  Membrane Rupture Time/Date: 8:15 AM ,01/13/2017   Intrapartum Procedures: Episiotomy: None [1]                                         Lacerations:  1st degree [2]  Patient had a delivery of a Viable infant. 01/13/2017  Information for the patient's newborn:  Dennison MascotGarcia-Sanchez, Boy Veena [[id number]][[id number]]  Delivery Method: Vag-Spont    Pateint had an uncomplicated postpartum course.  She is ambulating, tolerating a regular diet, passing flatus, and urinating well. Patient is discharged home in stable condition on 01/14/17.   Physical exam  Vitals:   01/13/17 1158 01/13/17 1615 01/13/17 2101 01/14/17 0530  BP: (!) 101/58 115/64 112/68 (!) 103/56  Pulse: 73 73 78 71  Resp: 18 18 17 18   Temp: 98.5 F (36.9 C) 98.3 F (36.8 C) 98.1 F (36.7 C) 97.9 F (36.6 C)  TempSrc: Oral Oral Oral Axillary  SpO2:      Weight:      Height:       General: alert and cooperative Lochia: appropriate Uterine Fundus: firm DVT Evaluation: No evidence of DVT seen on physical exam. Labs: Lab Results  Component Value Date   WBC 9.6 01/13/2017   HGB 13.3 01/13/2017   HCT 40.3  01/13/2017   MCV 90.2 01/13/2017   PLT 144 (L) 01/13/2017   No flowsheet data found.  Discharge instruction: per After Visit Summary and "Baby and Me Booklet".  After visit meds:  Allergies as of 01/14/2017   No Known Allergies     Medication List    TAKE these medications   ibuprofen 600 MG tablet Commonly known as:  ADVIL,MOTRIN Take 1 tablet (600 mg total) by mouth every 6 (six) hours as needed.   Prenatal Vitamins 0.8 MG tablet Take 1 tablet daily by mouth.       Diet: routine diet  Activity: Advance as tolerated. Pelvic rest for 6 weeks.   Outpatient follow up:4 weeks, with option for 2 week mental health check due to hx PPD Follow up Appt:No future appointments. Follow up Visit:No Follow-up on file.  Postpartum contraception: Undecided  Newborn Data: Live born female  Birth Weight: 6 lb 9.5 oz (2990 g) APGAR: 9, 9  Newborn Delivery   Birth date/time:  01/13/2017 08:27:00 Delivery type:  VBAC, Spontaneous     Baby Feeding: Breast Disposition:home with mother   01/14/2017 Cam HaiSHAW, Terresa Marlett, CNM 9:41 AM

## 2017-01-14 NOTE — Discharge Instructions (Signed)

## 2017-01-15 ENCOUNTER — Encounter: Payer: Self-pay | Admitting: Obstetrics and Gynecology

## 2017-01-15 ENCOUNTER — Ambulatory Visit: Payer: Self-pay

## 2017-01-15 NOTE — Lactation Note (Signed)
This note was copied from a baby's chart. Lactation Consultation Note Baby sleeping. 39 hrs old. Mom speaks good [place], denied need for interpreter. Mom stated she doesn't have milk for the baby. This is her 4th child. Mom 1st child now 589 yr old, BF for 3 1/2 months, her twins now almost 32 yrs old BF them for 1 month. babies were in NICU, mom pumped and bottle fed, put to breast as well. Mom breast and formula fed all children, plans to do the same with this baby.  Discussed supply and demand. Mom is pumping, and states she has nothing. Discussed consistency of colostrum, milk supply comes 3-5 days. Encouraged to cont. To put baby to breast and pump. LC hand expressed nothing from Lt. Breast, dots to Rt. Breast. Saw residue of colostrum from Rt. Nipple.  Mom has round breast w/short shaft small nipples. Breast full feeling. Mom denies feeling different after feeling. Mom stated her breast are soft. LC explained to mom , her breast aren't soft, feel as if starting to have a lot of colostrum, or has swelling to breast. Mom doesn't have a lot of edema.  Mom has DEBP at bedside, encouraged to cont,. To pump 5-6 times a day. Hand express afterwards, give baby any colostrum she collects. Mom asked for pacifier, discouraged for 2 weeks w/explaination. Encouraged STS. Mom stated she has been doing STS. Mom giving formula after BF because she feels as if she doesn't have anything as of yet.  Praised mom for pumping and cont. To increase milk supply. Shells given to wear in bra. Mom encouraged to feed baby 8-12 times/24 hours and with feeding cues. Newborn behavior discussed.  Mom has WIC.  [facility] brochure given w/resources, support groups and LC services. Patient Name: Boy Luanna ColeMaricela Garcia-Sanchez VWUJW'JToday's Date: 01/15/2017 Reason for consult: Initial assessment   Maternal Data Has patient been taught Hand Expression?: Yes Does the patient have breastfeeding experience prior to this delivery?:  Yes  Feeding    LATCH Score Latch: (told mother to call RN for latch score)     Type of Nipple: (S) Everted at rest and after stimulation(short shaft)  Comfort (Breast/Nipple): Soft / non-tender        Interventions Interventions: Breast feeding basics reviewed;Breast compression;DEBP;Breast massage;Shells;Pre-pump if needed  Lactation Tools Discussed/Used Tools: Pump Breast pump type: Double-Electric Breast Pump WIC Program: Yes Pump Review: Setup, frequency, and cleaning;Milk Storage Initiated by:: RN Date initiated:: 01/14/17   Consult Status Consult Status: Follow-up Date: 01/15/17 Follow-up type: In-patient    Elsworth Ledin, Diamond NickelLAURA G 01/15/2017, 1:01 AM

## 2017-01-15 NOTE — Lactation Note (Signed)
This note was copied from a baby's chart. Lactation Consultation Note  Patient Name: Courtney Franco WUJWJ'XToday's Date: 01/15/2017 Reason for consult: Follow-up assessment   P4, Baby 47 hours old.  Mother states since last night she has started breastfeeding before offering formula. Reviewed hand expression with mother.  Mother's nipples are tender and breasts are firm.  No drops expressed. Encouraged depth and performed chin tug to increase gape. Provided mother w/ manual pump and reviewed use. Mom encouraged to feed baby 8-12 times/24 hours and with feeding cues before offering formula to help establish her milk supply. Reviewed engorgement care and monitoring voids/stools.     Maternal Data    Feeding    LATCH Score                   Interventions    Lactation Tools Discussed/Used     Consult Status      Dahlia ByesBerkelhammer, [facility]ville[facility] 01/15/2017, 8:08 AM

## 2017-01-15 NOTE — Addendum Note (Signed)
Addendum  created 01/15/17 0849 by Jairo BenJackson, Labaron Digirolamo, MD   Intraprocedure Staff edited

## 2017-02-24 ENCOUNTER — Ambulatory Visit (INDEPENDENT_AMBULATORY_CARE_PROVIDER_SITE_OTHER): Payer: Self-pay | Admitting: Student

## 2017-02-24 ENCOUNTER — Encounter: Payer: Self-pay | Admitting: Student

## 2017-02-24 DIAGNOSIS — Z1389 Encounter for screening for other disorder: Secondary | ICD-10-CM

## 2017-02-24 MED ORDER — PRENATAL VITAMINS 0.8 MG PO TABS: 1.0000 | ORAL_TABLET | Freq: Every day | ORAL | 3 refills | Status: DC

## 2017-02-24 NOTE — Patient Instructions (Addendum)
Courtney Franco (Revised August 2014)    Insufficient Money for Medicine:           [address]: call "211"   . MAP Program at [place] [phone] or HP [phone]            No Primary Care Doctor:  To locate a primary care doctor that accepts your insurance or provides certain services:           [place]: [phone]           Physician Referral Service: [phone] ask for "My [place]" . If no insurance, you need to see if you qualify for [facility] "orange card", call to set      up appointment for eligibility/enrollment at [phone] or [phone] or visit [place] ([address], [place] and [place]) to meet with a [facility] enrollment specialist.  Agencies that provide inexpensive (sliding fee scale) medical care:   Marland Kitchen    [facility] - Family Medicine at [facility] .    [facility] Adult and [place] [phone] .    [facility] Internal Medicine - [phone] .    [place] [phone] .    [facility] for [place] [phone] .    [place] [phone] . [facility] - [place] @ [place] [phone]     [phone] . [facility] - [place] @ [place] [phone] . [facility]: [phone]  . Women's Clinic: [phone]  . Planned Parenthood: [phone]  . Family Services of the [place] Providers:           [facility] - [phone] (No Family Planning accepted)          [address], [phone], Mon-Fri 9am-5pm          [facility] [phone] . [place], [address], [place] 8am-5pm, Fri 8am-noon . [place], [address], Mon-Fri 7:30am-4:30pm          [facility] - [phone]          [address], [place] [phone] [address], [address]          Only accepts [place] [employer] patients after they have their name applied to their card  Self Pay (no insurance) in [facility]:           Sickle Cell Patients:  . [place], [phone] [place] [facility]: . [address] [place] [phone]       [facility] . [place] [phone]  [facility]: . [address] [phone]          [facility] Urgent Care           [place], [phone] [facility] . [place], (  [phone]           [facility] Urgent Care [place]           [place], [address], [place] [address]           [phone], Mon-Fri 9am-7pm, [place] 9am-1pm          [facility] - Family Medicine @ [place], [place]          [facility] - [facility]           [address] [place] [facility] - [place] . [place] [phone]          [facility]           [address], [place]  [facility] - [place]  . [place], [phone] [facility] - [place] . [address], [phone]  Dr. [place] Lawman           [address], [place], [place]           [address], [phone]          [facility]             [address]  [phone]          [facility]           [place], [place], 1st & 3rd Saturday every month, 10am-1pm OTHERS:  [facility]  ([facility] Only)  [phone] (Thursday only) Strategies for finding a Primary Care Provider:  1) Find a Doctor and Pay Out of Pocket  Although you won't have to find out who is covered by your insurance plan, it is a good idea to ask around and get recommendations. You will then need to call the office and see if the doctor you have chosen will accept you as a new patient and what types of options they offer for patients who are self-pay. Some doctors offer discounts or will set up payment plans for their patients who do not have insurance, but you will need to ask so you aren't surprised when you get to your appointment.  2) [place] - To see if you qualify for "orange card" access to healthcare safety net providers.  Call for appointment for eligibility/enrollment at [phone] or [phone]. (Uninsured, 0-200% FPL, qualifying info)  Applicants for [facility] are first required to see if they are eligible to enroll in the [facility] Marketplace before enrolling in [facility] (and get an exemption if they are not).  GCCN Criteria for acceptance is:  ? Proof of ACA Marketing exemption - form or documentation  ? Valid photo ID (driver's license, state identification card, passport, home country ID)  ? Proof of [facility] residency (e.g. driver's license, lease/landlord information, pay stubs with address, utility bill, bank statement, etc.)  ? Proof of income (1040, last year's tax return, W2, 4 current pay stubs, other income proof)  ? Proof of assets (current bank statement +  3 most recent, disability paperwork, life insurance info, tax value on autos, etc.)  3) [place] Department  Not all health departments have doctors that can see patients for sick visits, but many do, so it is worth a call  to see if yours does. If you don't know where your local health department is, you can check in your phone book. The CDC also has a tool to help you locate your state's health department, and many state websites also have listings of all of their local health departments.  4) Find a [facility]  If your illness is not likely to be very severe or complicated, you may want to try a walk in clinic. These are popping up all over the country in pharmacies, drugstores, and shopping centers. They're usually staffed by nurse practitioners or physician assistants that have been trained to treat common illnesses and complaints. They're usually fairly quick and inexpensive. However, if you have serious medical issues or chronic medical problems, these are probably not your best option   STD Testing:           [place], [place] [facility]           [address], [place], phone [phone] or [phone]           Monday - Friday, call for an appointment          [place], [place] [facility]           [address], [place], phone [phone] or [phone]           Monday - Friday, call for an appointment Abuse/Neglect:           [place]: [place]: [phone] (After Hours) Emergency Shelter:  [facility] [phone]  [place]- [phone]  [place] - [phone]  [place] - [phone] (ages 21-17)  [place] @ [employer] - [phone]   [place] at [place]:           Room at the [place]: [phone]   (Homeless mother with children)          [place]: [phone] (Mothers only) . [facility] Focus: [phone] (Pregnant 68-65 years old) . Adopt a Mom  [phone]  [facility]  . [facility] - [place] . [address], [place] [phone]          [facility] [address], [place], [place]          [facility]           [place], [place]  [facility]           [phone]          [facility] in [place]           [phone]           [phone] (After Hours)  [place] Abuse Resources:           Alcohol and Drug Services: [phone]           [facility]: [phone]          [facility]: [phone]  . Narcotics Helpline [phone]          [facility]: [phone]           Residential & Outpatient Substance Abuse Program - [facility] [place]: [phone] . [employer]  [facility] and [place] [phone] Psychological Services:          [place]: [phone]  . Therapeutic Alternatives: [phone]          [place] LINE: [phone]     (24 Hour) . Mobile Crisis:  . HELPLINES:  [employer] on [place] [phone] [facility] on [place] [phone] . Walk In [place]  ([place] - [phone] or [phone]  [place]. [place] [phone]  [place] [address], [place] [phone]   Dental Assistance:  If unable to pay or uninsured, contact: [facility] to become qualified for the adult dental clinic. Patient must be enrolled in [facility] (uninsured, 0-200% FPL, qualifying info).  Enroll in [facility] first, then see Primary Care Physician assigned to you, the PCP makes a dental referral. [place] [facility] will receive referral and contacts patient for appointment.  Patients with Medicaid           [address], [place] (Children up to 58 + Pregnant Women) - [phone]  [place] - Suite [phone]  If unable to pay, or uninsured: contact [place] [phone] in [place] - ([place] only + Pregnant Women), [phone] in [place] only) to become qualified for the adult dental clinic  Must see if eligible to enroll in [place] before enrolling into the [facility] (exemption required) [phone] for an appointment)  [email or web address];   [phone].  If not eligible for ACA, then go by Department of Health and Human Services to see if eligible for "orange card."  [address] [place] and [place].  Once you get an orange card, you will have a Primary Care home who will then refer you to dental if needed.  Other Scientist, forensic:   [facility] - [phone] (ext [phone])   [address]  Dr. Donn Pierini - [phone]   [place]   [facility]           [place], [place], [place], [address]           [phone], Ext. [phone]           2nd and 4th Thursday of the month at 6:30am (Simple extractions only - no wisdom teeth or surgery) First come/First serve -First 10 clients served           [facility] [place], [place] and [place] residents only)          [address] Madelaine [place], [place], [address]           [phone]                    [place] Department           [phone]          [place]         [facility]          [phone]   Transportation Options:  Ambulance - 911 - [phone] per ride Family Member to accompany patient (if stable) - [place] - [phone]  PART - [phone]  Taxi - [phone] - [place] [phone] (Application required)  [facility] - [phone]       Health Maintenance, Female Adopting a healthy lifestyle and getting preventive care can go a long way to promote health and wellness. Talk with your health care provider about what schedule of regular examinations is right for you. This is a good chance for you to check in with your provider about disease prevention and staying healthy. In between checkups, there are plenty of things you can do on your own. Experts have done a lot of research about which lifestyle changes and preventive measures are most likely to keep you healthy. Ask your health care provider for more information. Weight and diet Eat a healthy diet  Be sure to include plenty of vegetables, fruits, low-fat dairy products, and lean protein.  Do not eat a lot of foods high in solid fats, added sugars, or salt.  Get regular exercise. This is one of the most important things you can do for your health. ? Most adults should exercise for at least 150 minutes each week. The exercise should increase your heart rate and make you sweat (moderate-intensity exercise). ? Most adults should also do strengthening exercises at least twice a week. This is in addition to the moderate-intensity exercise.  Maintain a healthy weight  Body mass index (BMI) is a measurement that can be used to identify  possible weight problems. It estimates body fat based on height and weight. Your health care provider can help determine your BMI and help you achieve or maintain a healthy weight.  For females 41 years of age and older: ? A BMI below 18.5 is considered underweight. ? A BMI of 18.5 to 24.9 is normal. ? A BMI of 25 to 29.9 is considered overweight. ? A BMI  of 30 and above is considered obese.  Watch levels of cholesterol and blood lipids  You should start having your blood tested for lipids and cholesterol at 33 years of age, then have this test every 5 years.  You may need to have your cholesterol levels checked more often if: ? Your lipid or cholesterol levels are high. ? You are older than 34 years of age. ? You are at high risk for heart disease.  Cancer screening Lung Cancer  Lung cancer screening is recommended for adults 2-65 years old who are at high risk for lung cancer because of a history of smoking.  A yearly low-dose CT scan of the lungs is recommended for people who: ? Currently smoke. ? Have quit within the past 15 years. ? Have at least a 30-pack-year history of smoking. A pack year is smoking an average of one pack of cigarettes a day for 1 year.  Yearly screening should continue until it has been 15 years since you quit.  Yearly screening should stop if you develop a health problem that would prevent you from having lung cancer treatment.  Breast Cancer  Practice breast self-awareness. This means understanding how your breasts normally appear and feel.  It also means doing regular breast self-exams. Let your health care provider know about any changes, no matter how small.  If you are in your 20s or 30s, you should have a clinical breast exam (CBE) by a health care provider every 1-3 years as part of a regular health exam.  If you are 27 or older, have a CBE every year. Also consider having a breast X-ray (mammogram) every year.  If you have a family history  of breast cancer, talk to your health care provider about genetic screening.  If you are at high risk for breast cancer, talk to your health care provider about having an MRI and a mammogram every year.  Breast cancer gene (BRCA) assessment is recommended for women who have family members with BRCA-related cancers. BRCA-related cancers include: ? Breast. ? Ovarian. ? Tubal. ? Peritoneal cancers.  Results of the assessment will determine the need for genetic counseling and BRCA1 and BRCA2 testing.  Cervical Cancer Your health care provider may recommend that you be screened regularly for cancer of the pelvic organs (ovaries, uterus, and vagina). This screening involves a pelvic examination, including checking for microscopic changes to the surface of your cervix (Pap test). You may be encouraged to have this screening done every 3 years, beginning at age 31.  For women ages 50-65, health care providers may recommend pelvic exams and Pap testing every 3 years, or they may recommend the Pap and pelvic exam, combined with testing for human papilloma virus (HPV), every 5 years. Some types of HPV increase your risk of cervical cancer. Testing for HPV may also be done on women of any age with unclear Pap test results.  Other health care providers may not recommend any screening for nonpregnant women who are considered low risk for pelvic cancer and who do not have symptoms. Ask your health care provider if a screening pelvic exam is right for you.  If you have had past treatment for cervical cancer or a condition that could lead to cancer, you need Pap tests and screening for cancer for at least 20 years after your treatment. If Pap tests have been discontinued, your risk factors (such as having a new sexual partner) need to be reassessed to determine if screening should resume.  Some women have medical problems that increase the chance of getting cervical cancer. In these cases, your health care provider  may recommend more frequent screening and Pap tests.  Colorectal Cancer  This type of cancer can be detected and often prevented.  Routine colorectal cancer screening usually begins at 33 years of age and continues through 33 years of age.  Your health care provider may recommend screening at an earlier age if you have risk factors for colon cancer.  Your health care provider may also recommend using home test kits to check for hidden blood in the stool.  A small camera at the end of a tube can be used to examine your colon directly (sigmoidoscopy or colonoscopy). This is done to check for the earliest forms of colorectal cancer.  Routine screening usually begins at age 1.  Direct examination of the colon should be repeated every 5-10 years through 33 years of age. However, you may need to be screened more often if early forms of precancerous polyps or small growths are found.  Skin Cancer  Check your skin from head to toe regularly.  Tell your health care provider about any new moles or changes in moles, especially if there is a change in a mole's shape or color.  Also tell your health care provider if you have a mole that is larger than the size of a pencil eraser.  Always use sunscreen. Apply sunscreen liberally and repeatedly throughout the day.  Protect yourself by wearing long sleeves, pants, a wide-brimmed hat, and sunglasses whenever you are outside.  Heart disease, diabetes, and high blood pressure  High blood pressure causes heart disease and increases the risk of stroke. High blood pressure is more likely to develop in: ? People who have blood pressure in the high end of the normal range (130-139/85-89 mm Hg). ? People who are overweight or obese. ? People who are African American.  If you are 53-57 years of age, have your blood pressure checked every 3-5 years. If you are 36 years of age or older, have your blood pressure checked every year. You should have your  blood pressure measured twice-once when you are at a hospital or clinic, and once when you are not at a hospital or clinic. Record the average of the two measurements. To check your blood pressure when you are not at a hospital or clinic, you can use: ? An automated blood pressure machine at a pharmacy. ? A home blood pressure monitor.  If you are between 49 years and 14 years old, ask your health care provider if you should take aspirin to prevent strokes.  Have regular diabetes screenings. This involves taking a blood sample to check your fasting blood sugar level. ? If you are at a normal weight and have a low risk for diabetes, have this test once every three years after 33 years of age. ? If you are overweight and have a high risk for diabetes, consider being tested at a younger age or more often. Preventing infection Hepatitis B  If you have a higher risk for hepatitis B, you should be screened for this virus. You are considered at high risk for hepatitis B if: ? You were born in a country where hepatitis B is common. Ask your health care provider which countries are considered high risk. ? Your parents were born in a high-risk country, and you have not been immunized against hepatitis B (hepatitis B vaccine). ? You have HIV or AIDS. ?  You use needles to inject street drugs. ? You live with someone who has hepatitis B. ? You have had sex with someone who has hepatitis B. ? You get hemodialysis treatment. ? You take certain medicines for conditions, including cancer, organ transplantation, and autoimmune conditions.  Hepatitis C  Blood testing is recommended for: ? Everyone born from [birth year] through 1966. ? Anyone with known risk factors for hepatitis C.  Sexually transmitted infections (STIs)  You should be screened for sexually transmitted infections (STIs) including gonorrhea and chlamydia if: ? You are sexually active and are younger than 33 years of age. ? You are older than 33  years of age and your health care provider tells you that you are at risk for this type of infection. ? Your sexual activity has changed since you were last screened and you are at an increased risk for chlamydia or gonorrhea. Ask your health care provider if you are at risk.  If you do not have HIV, but are at risk, it may be recommended that you take a prescription medicine daily to prevent HIV infection. This is called pre-exposure prophylaxis (PrEP). You are considered at risk if: ? You are sexually active and do not regularly use condoms or know the HIV status of your partner(s). ? You take drugs by injection. ? You are sexually active with a partner who has HIV.  Talk with your health care provider about whether you are at high risk of being infected with HIV. If you choose to begin PrEP, you should first be tested for HIV. You should then be tested every 3 months for as long as you are taking PrEP. Pregnancy  If you are premenopausal and you may become pregnant, ask your health care provider about preconception counseling.  If you may become pregnant, take 400 to 800 micrograms (mcg) of folic acid every day.  If you want to prevent pregnancy, talk to your health care provider about birth control (contraception). Osteoporosis and menopause  Osteoporosis is a disease in which the bones lose minerals and strength with aging. This can result in serious bone fractures. Your risk for osteoporosis can be identified using a bone density scan.  If you are 57 years of age or older, or if you are at risk for osteoporosis and fractures, ask your health care provider if you should be screened.  Ask your health care provider whether you should take a calcium or vitamin D supplement to lower your risk for osteoporosis.  Menopause may have certain physical symptoms and risks.  Hormone replacement therapy may reduce some of these symptoms and risks. Talk to your health care provider about whether  hormone replacement therapy is right for you. Follow these instructions at home:  Schedule regular health, dental, and eye exams.  Stay current with your immunizations.  Do not use any tobacco products including cigarettes, chewing tobacco, or electronic cigarettes.  If you are pregnant, do not drink alcohol.  If you are breastfeeding, limit how much and how often you drink alcohol.  Limit alcohol intake to no more than 1 drink per day for nonpregnant women. One drink equals 12 ounces of beer, 5 ounces of wine, or 1 ounces of hard liquor.  Do not use street drugs.  Do not share needles.  Ask your health care provider for help if you need support or information about quitting drugs.  Tell your health care provider if you often feel depressed.  Tell your health care provider if you have  ever been abused or do not feel safe at home. This information is not intended to replace advice given to you by your health care provider. Make sure you discuss any questions you have with your health care provider. Document Released: 08/05/2010 Document Revised: 06/28/2015 Document Reviewed: 10/24/2014 Elsevier Interactive Patient Education  [employer].

## 2017-02-24 NOTE — Progress Notes (Signed)
Postpartum Subjective:     Courtney Franco is a 32 y.o. female who presents for a postpartum visit. She is 5 weeks postpartum following a spontaneous vaginal delivery. I have fully reviewed the prenatal and intrapartum course. The delivery was at 39 gestational weeks. Outcome: vaginal birth after cesarean (VBAC). Anesthesia: epidural. Postpartum course has been normal. Baby's course has been normal. Baby is feeding by bottle - gerber & breast fed twice per day. Bleeding no bleeding. Bowel function is normal. Bladder function is normal. Patient is not sexually active. Contraception method is none. Postpartum depression screening: negative.  The following portions of the patient's history were reviewed and updated as appropriate: allergies, current medications, past family history, past medical history, past social history, past surgical history and problem list.  Review of Systems Pertinent items are noted in HPI.   Objective:    BP 107/69   Pulse 69   Ht 5' 0.5" (1.537 m)   Wt 178 lb 11.2 oz (81.1 kg)   LMP 04/05/2016   BMI 34.33 kg/m   General:  alert and cooperative  Lungs: clear to auscultation bilaterally  Heart:  regular rate and rhythm, S1, S2 normal, no murmur, click, rub or gallop  Abdomen: soft, non-tender; bowel sounds normal; no masses,  no organomegaly        Assessment:     Normal postpartum exam. Pap smear not done at today's visit. Negative pap during prenatal care.   Plan:   1. Postpartum care and examination -pt declines contraception at this time --- discussed using condoms to prevent pregnancy too soon after delivery - Prenatal Multivit-Min-Fe-FA (PRENATAL VITAMINS) 0.8 MG tablet; Take 1 tablet by mouth daily.  Dispense: 90 tablet; Refill: 3 -List of PCP resources given  F/u yearly for routine gyn exams  Judeth HornLawrence, Alexy Heldt, NP

## 2017-03-04 ENCOUNTER — Other Ambulatory Visit: Payer: Self-pay | Admitting: *Deleted

## 2017-03-04 MED ORDER — PRENATAL VITAMINS 0.8 MG PO TABS: 1.0000 | ORAL_TABLET | Freq: Every day | ORAL | 3 refills | Status: AC

## 2017-03-04 NOTE — Progress Notes (Signed)
RX printed and patient did not receive, rx called in to pharmacy.

## 2018-09-07 ENCOUNTER — Other Ambulatory Visit: Payer: Self-pay

## 2018-09-07 DIAGNOSIS — Z20822 Contact with and (suspected) exposure to covid-19: Secondary | ICD-10-CM

## 2018-09-08 LAB — NOVEL CORONAVIRUS, NAA: SARS-CoV-2, NAA: NOT DETECTED

## 2018-09-17 IMAGING — US US MFM OB DETAIL+14 WK
1 series · 13 of 28 positions shown · non-contrast
Comparison: none

[Series 1: us mfm ob detail+14 wk · 76 acquisitions, 13 frames shown]
[im 3/76]
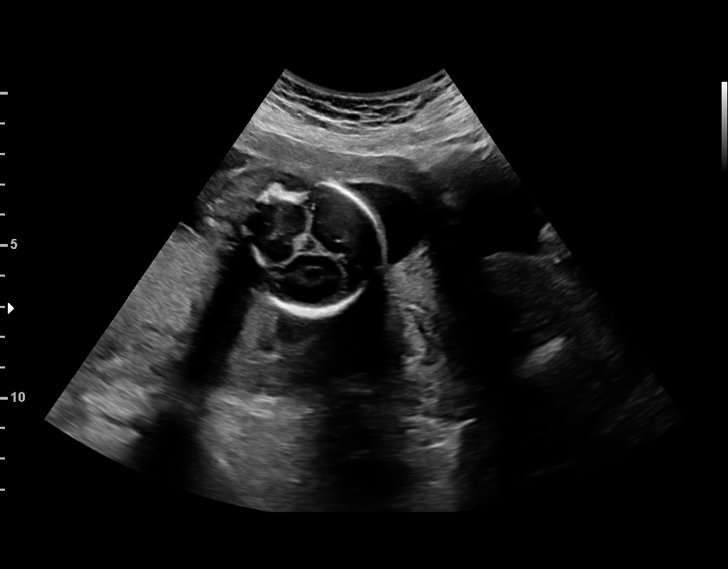
[im 9/76]
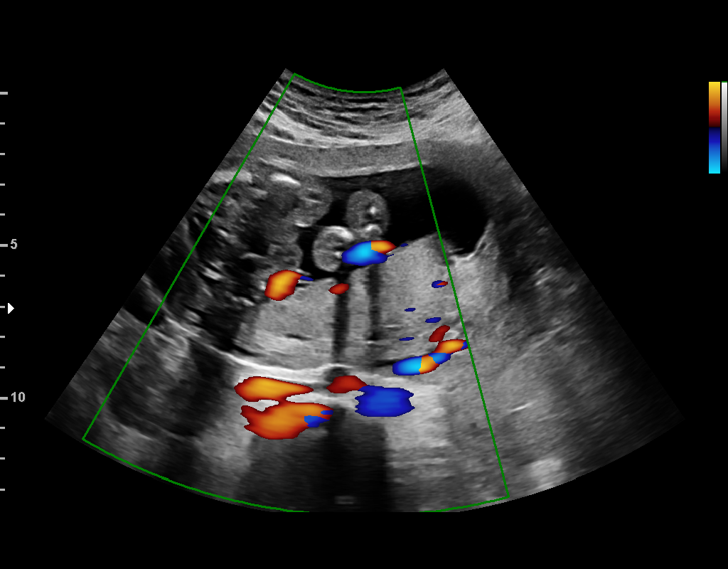
[im 14/76]
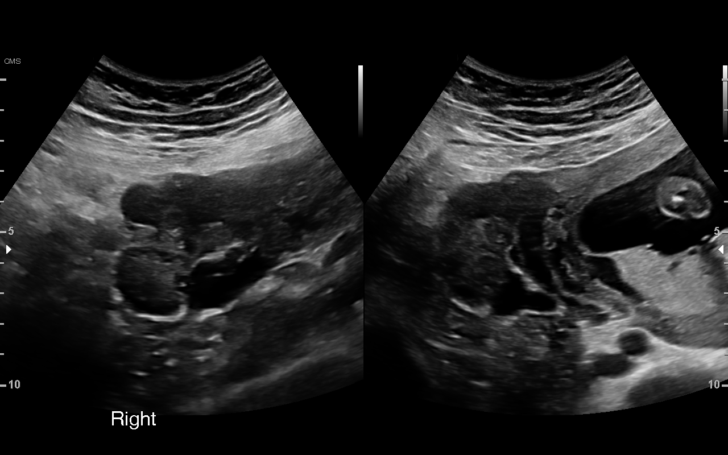
[im 20/76]
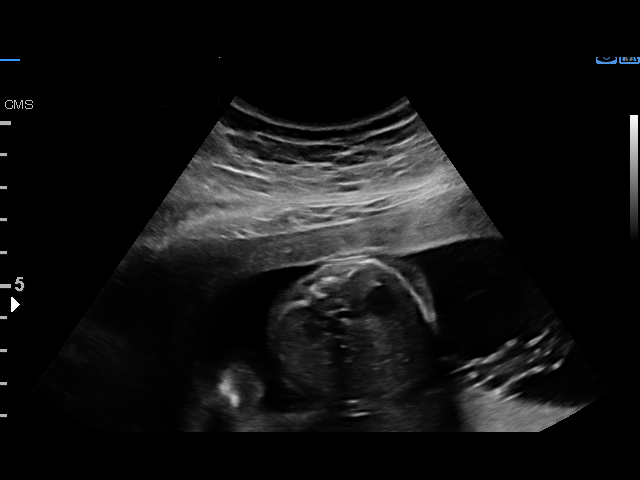
[im 26/76]
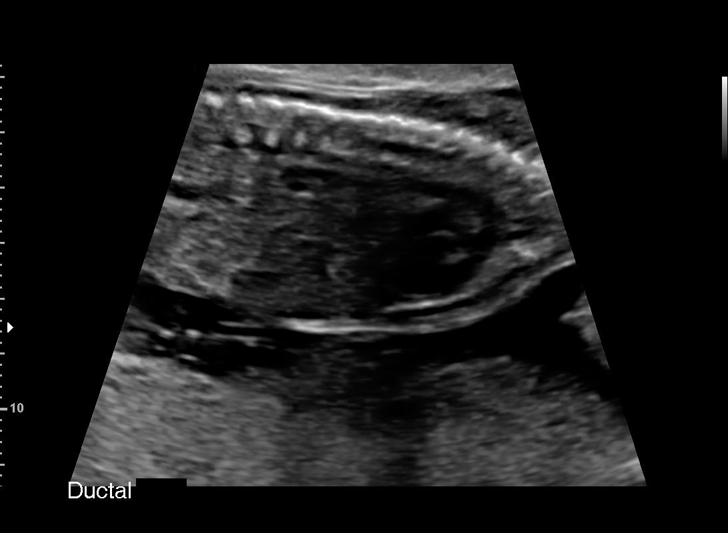
[im 31/76]
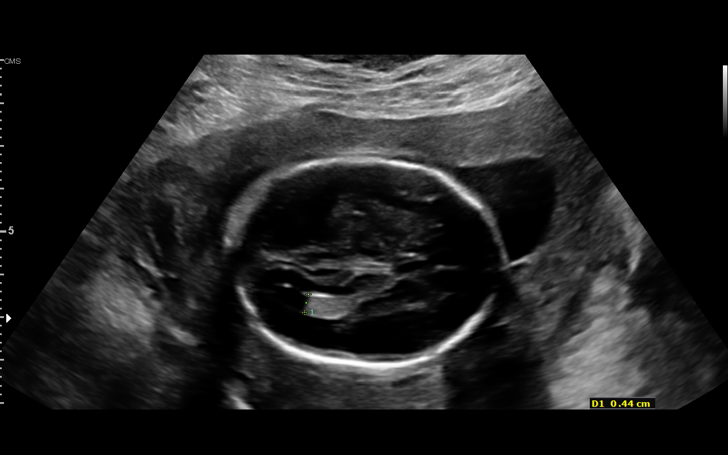
[im 39/76]
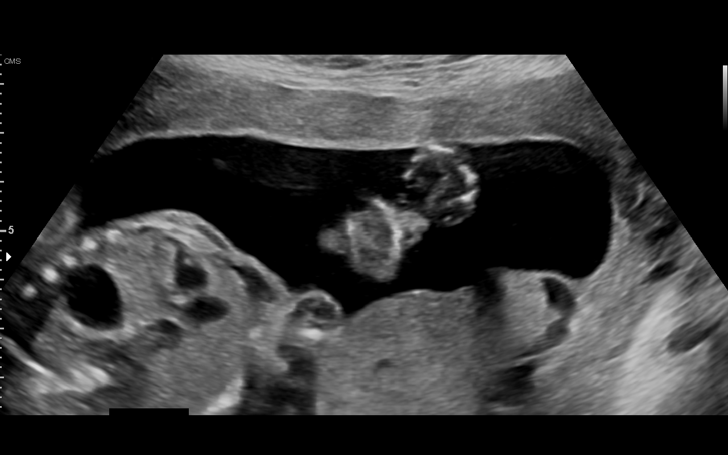
[im 45/76]
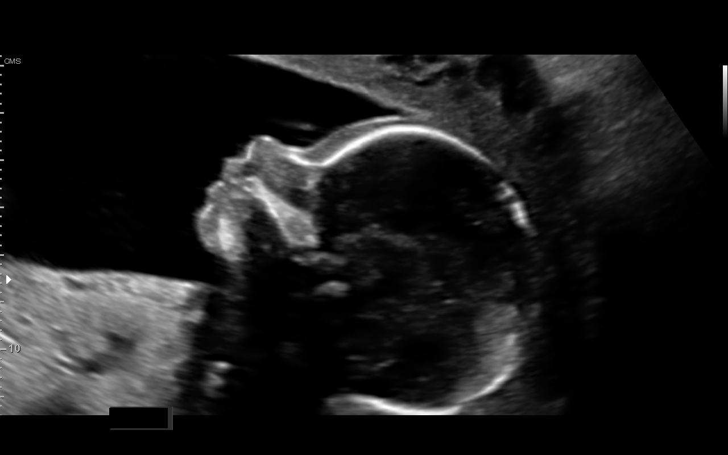
[im 51/76]
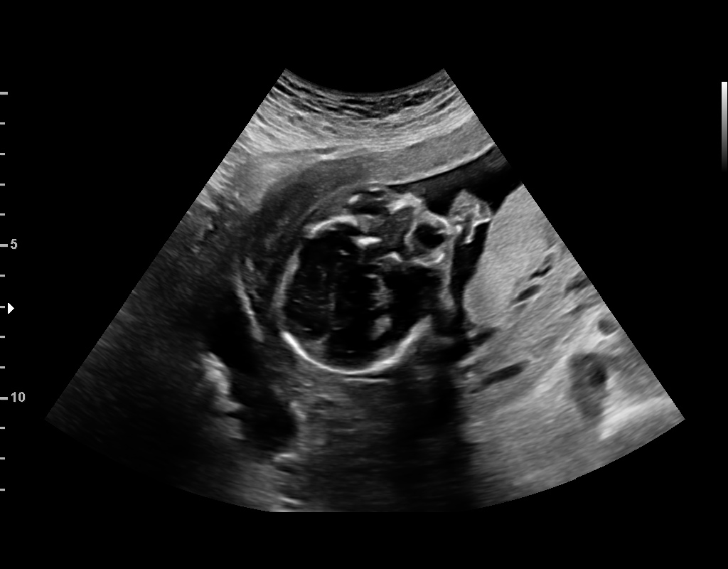
[im 56/76]
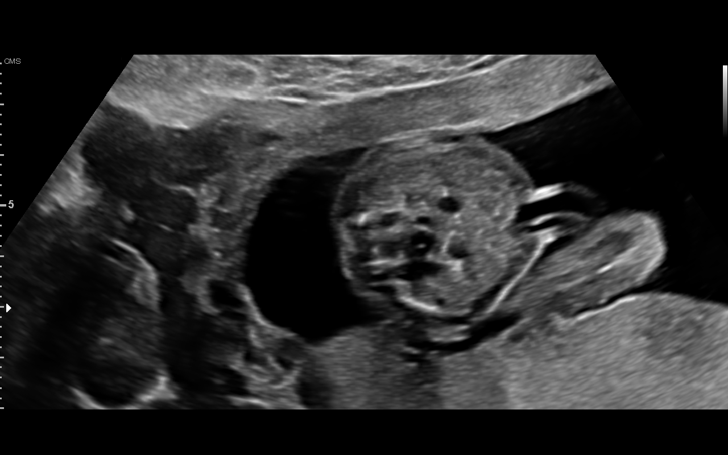
[im 62/76]
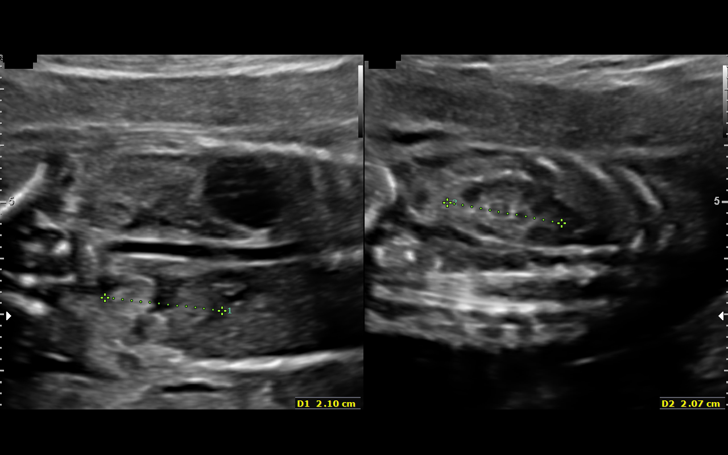
[im 67/76]
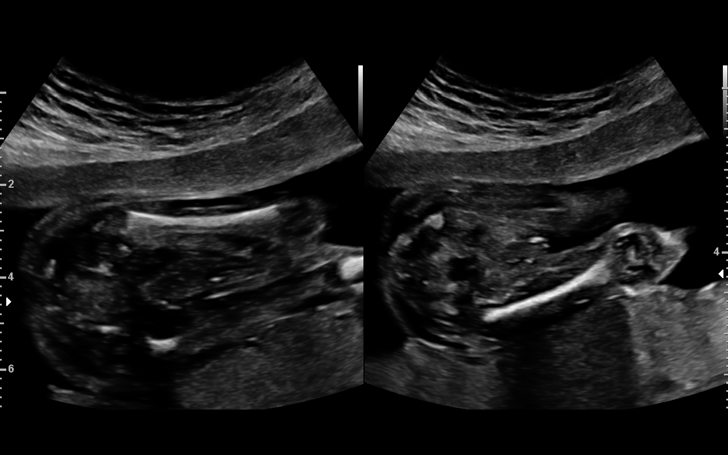
[im 73/76]
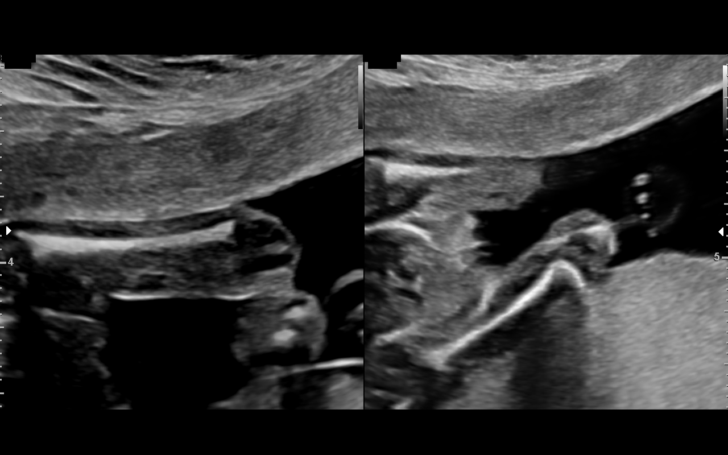

[13 of 28 positions shown; findings below may reference images not displayed]

DIE

OB/Gyn Clinic

1  CROW JIM          [id number]       [id number]     [id number]
Indications

21 weeks gestation of pregnancy
Encounter for fetal anatomic survey
Poor obstetric history: Previous preterm
delivery, antepartum (34 week twins)
Previous cesarean delivery, antepartum
OB History

Gravidity:    3         Term:   1        Prem:   1        SAB:   0
TOP:          0       Ectopic:  0        Living: 3
Fetal Evaluation

Num Of Fetuses:     1
Fetal Heart         136
Rate(bpm):
Cardiac Activity:   Observed
Presentation:       Cephalic
Placenta:           Posterior, above cervical os
P. Cord Insertion:  Marginal insertion

Amniotic Fluid
AFI FV:      Subjectively within normal limits

Largest Pocket(cm)
5.50
Biometry

BPD:      47.1  mm     G. Age:  20w 2d          9  %    CI:        67.19   %   70 - 86
FL/HC:      18.0   %   15.9 -
HC:       184   mm     G. Age:  20w 6d         16  %    HC/AC:      1.16       1.06 -
AC:      158.4  mm     G. Age:  21w 0d         28  %    FL/BPD:     70.3   %
FL:       33.1  mm     G. Age:  20w 2d         12  %    FL/AC:      20.9   %   20 - 24
HUM:      31.3  mm     G. Age:  20w 3d         21  %
CER:      21.9  mm     G. Age:  20w 6d         32  %

CM:        7.6  mm

Est. FW:     370  gm    0 lb 13 oz      27  %
Gestational Age

LMP:           22w 5d       Date:   04/05/16                 EDD:   01/10/17
U/S Today:     20w 4d                                        EDD:   01/25/17
Best:          21w 3d    Det. By:   Previous Ultrasound      EDD:   01/19/17
(08/18/16)
Anatomy

Cranium:               Appears normal         Aortic Arch:            Appears normal
Cavum:                 Appears normal         Ductal Arch:            Appears normal
Ventricles:            Appears normal         Diaphragm:              Appears normal
Choroid Plexus:        Appears normal         Stomach:                Appears normal, left
sided
Cerebellum:            Appears normal         Abdomen:                Appears normal
Posterior Fossa:       Appears normal         Abdominal Wall:         Appears nml (cord
insert, abd wall)
Nuchal Fold:           Not applicable (>20    Cord Vessels:           Appears normal (3
wks GA)                                        vessel cord)
Face:                  Appears normal         Kidneys:                Appear normal
(orbits and profile)
Lips:                  Appears normal         Bladder:                Appears normal
Thoracic:              Appears normal         Spine:                  Appears normal
Heart:                 Appears normal         Upper Extremities:      Appears normal
(4CH, axis, and
situs)
RVOT:                  Appears normal         Lower Extremities:      Appears normal
LVOT:                  Appears normal

Other:  Fetus appears to be a male. Heels visualized. Nasal bone visualized.
Cervix Uterus Adnexa

Cervix
Length:            3.6  cm.
Normal appearance by transabdominal scan.

Uterus
No abnormality visualized.

Left Ovary
Within normal limits.

Right Ovary
Within normal limits.

Cul De Sac:   No free fluid seen.
Adnexa:       No abnormality visualized.
Impression

SIUP at 21+3 weeks (by previous US)
Normal interval anatomy; anatomic survey complete
Normal amniotic fluid volume
Appropriate interval growth with EFW at the 27th %tile
Recommendations

Follow-up ultrasound for growth and to confirm EDC in 4-6
weeks

## 2018-10-27 IMAGING — US US MFM OB FOLLOW-UP
1 series · 14 of 28 positions shown · non-contrast
Comparison: none

[Series 1: us mfm ob follow-up · 14 of 44 slices shown]
[im 2/44]
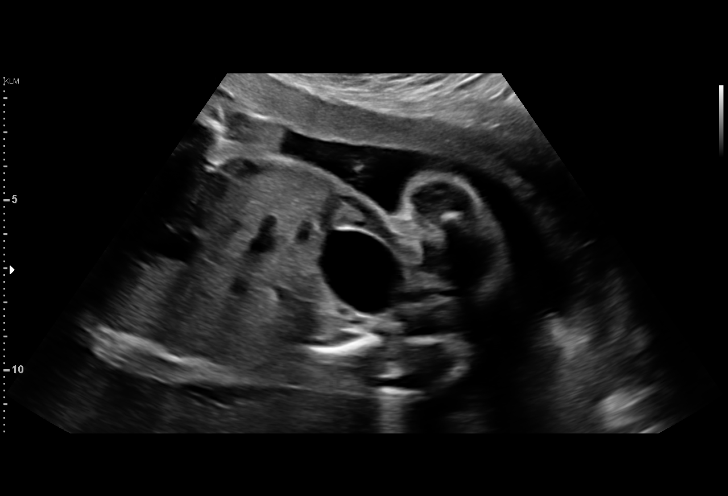
[im 5/44]
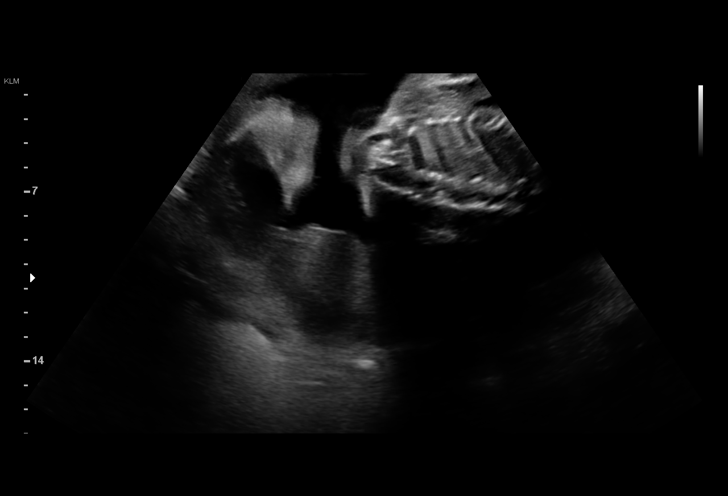
[im 8/44]
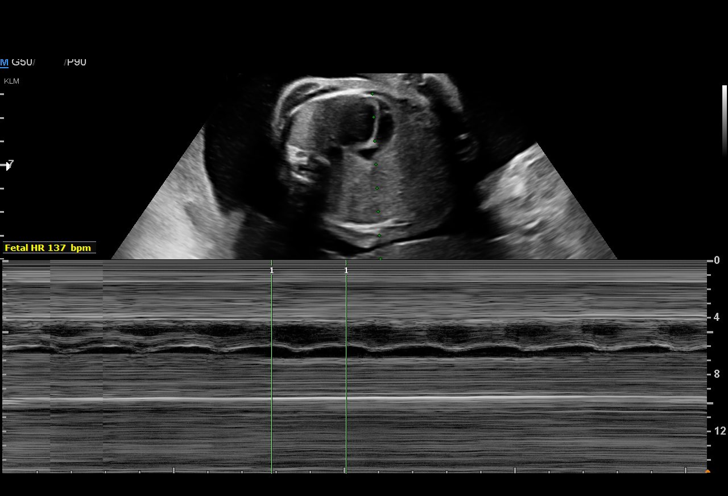
[im 12/44]
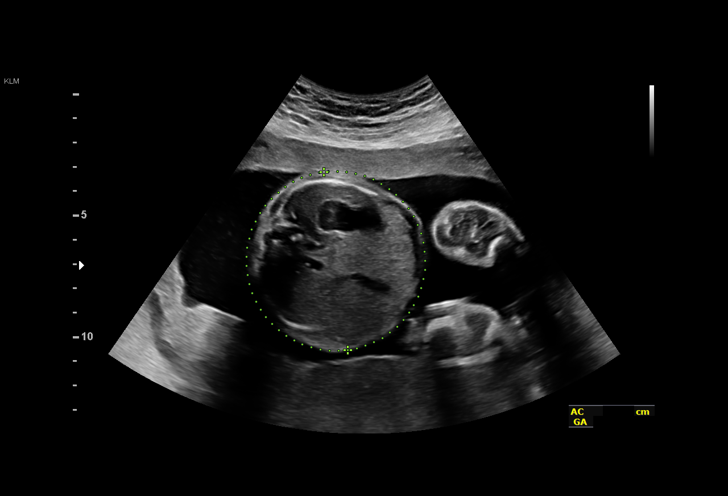
[im 15/44]
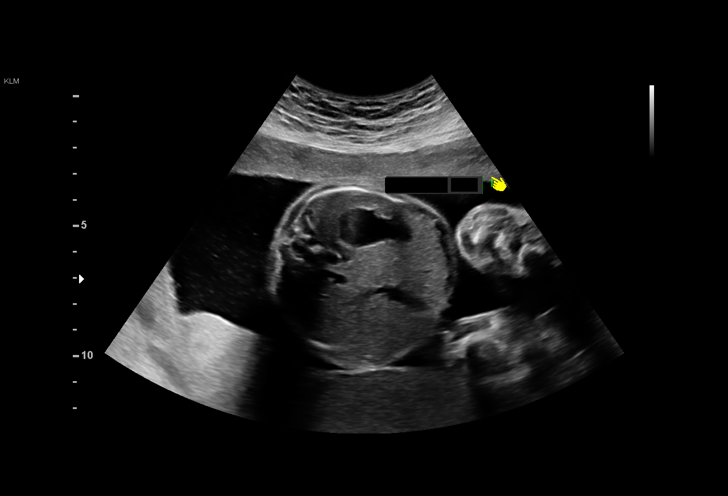
[im 18/44]
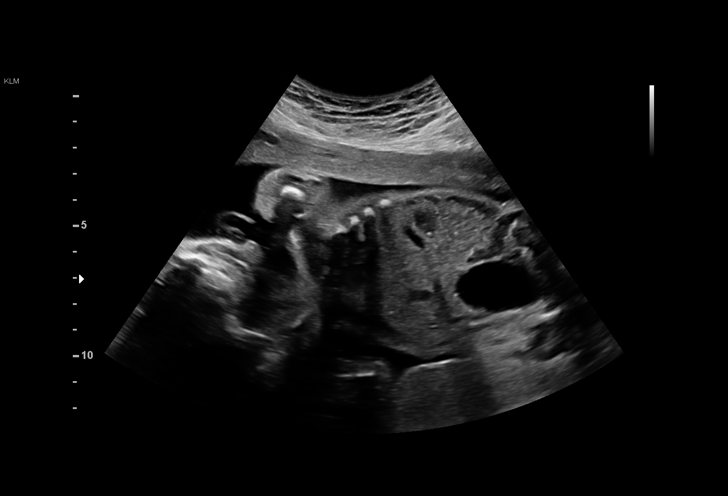
[im 21/44]
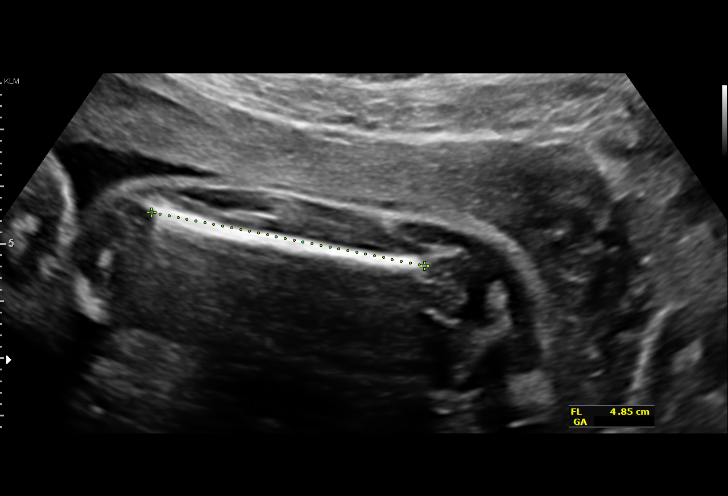
[im 24/44]
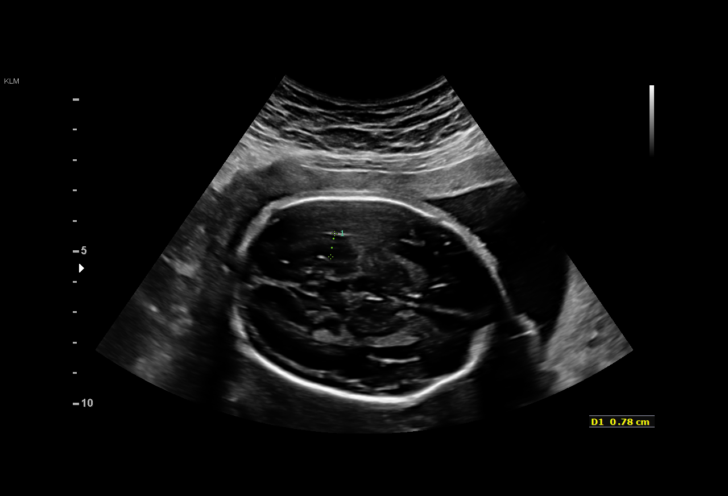
[im 28/44]
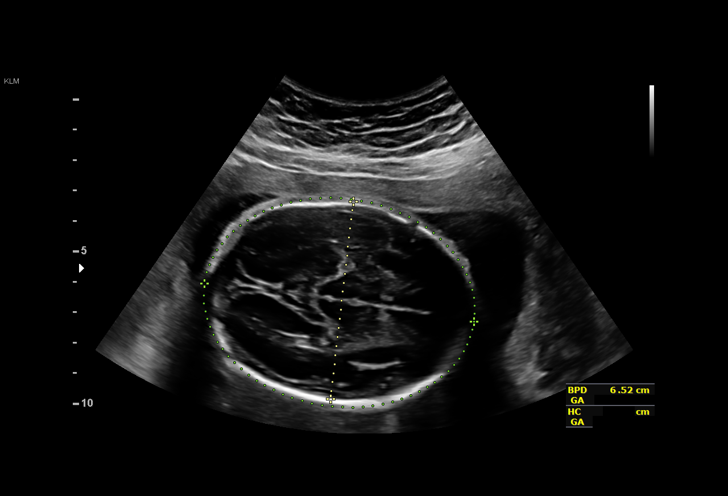
[im 31/44]
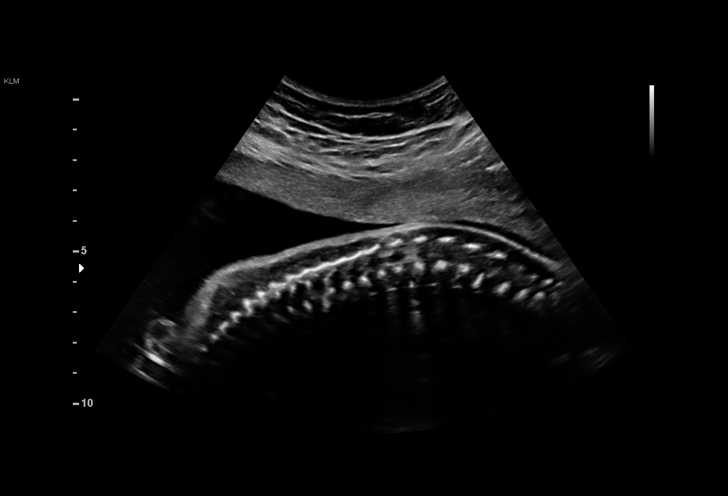
[im 34/44]
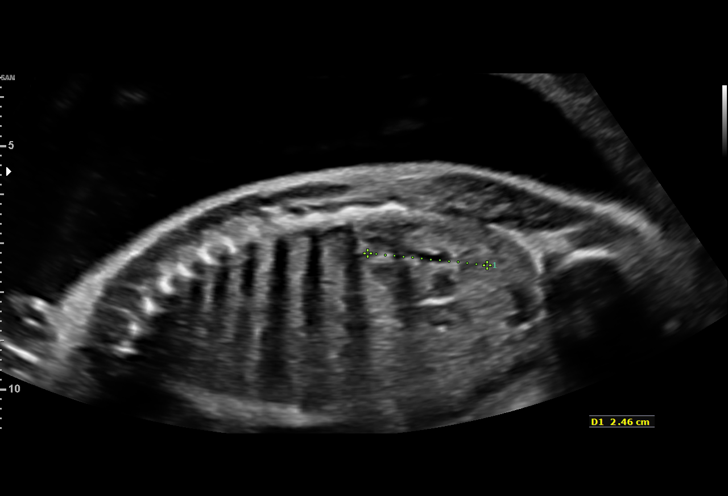
[im 37/44]
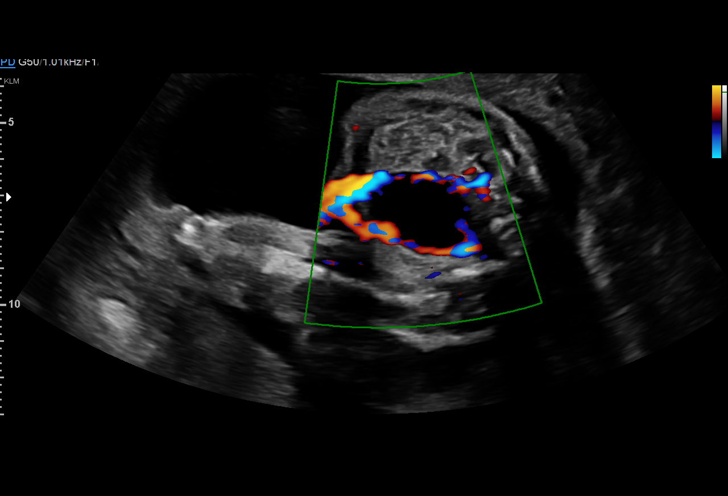
[im 40/44]
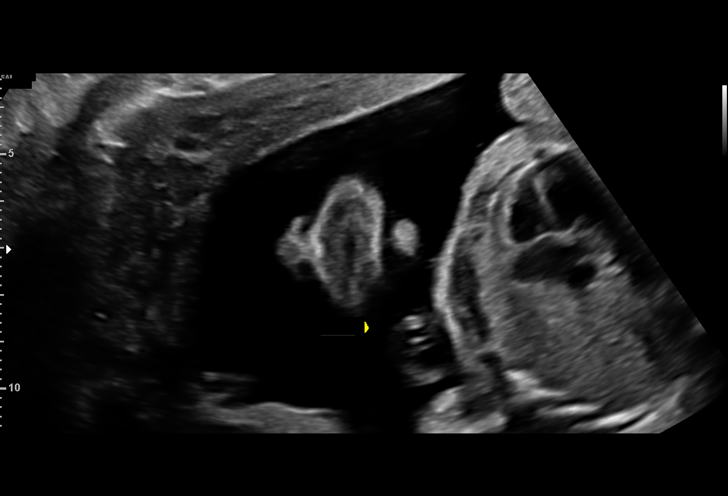
[im 44/44]
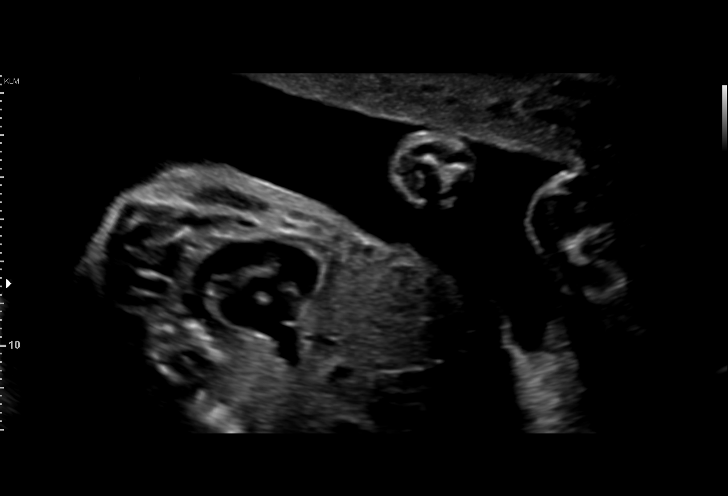

[14 of 28 positions shown; findings below may reference images not displayed]

[facility]

OB/Gyn Clinic

1  RHANNA JINAN             [id number]       [id number]     [id number]
Indications

27 weeks gestation of pregnancy
Poor obstetric history: Previous preterm
delivery, antepartum (34 week twins)
Previous cesarean delivery, antepartum
Encounter for uncertain dates
OB History

Gravidity:    3         Term:   1        Prem:   1        SAB:   0
TOP:          0       Ectopic:  0        Living: 3
Fetal Evaluation

Num Of Fetuses:     1
Fetal Heart         137
Rate(bpm):
Cardiac Activity:   Observed
Presentation:       Breech
Placenta:           Posterior, above cervical os
P. Cord Insertion:  Previously Visualized

Amniotic Fluid
AFI FV:      Subjectively within normal limits

Largest Pocket(cm)
6.4
Biometry

BPD:      65.4  mm     G. Age:  26w 3d         17  %    CI:        69.55   %   70 - 86
FL/HC:      19.3   %   18.6 -
HC:      250.3  mm     G. Age:  27w 1d         24  %    HC/AC:      1.08       1.05 -
AC:      231.6  mm     G. Age:  27w 4d         53  %    FL/BPD:     74.0   %   71 - 87
FL:       48.4  mm     G. Age:  26w 2d         14  %    FL/AC:      20.9   %   20 - 24

Est. FW:    7009  gm      2 lb 4 oz     49  %
Gestational Age

LMP:           28w 3d       Date:   04/05/16                 EDD:   01/10/17
U/S Today:     26w 6d                                        EDD:   01/21/17
Best:          27w 1d    Det. By:   Previous Ultrasound      EDD:   01/19/17
(08/18/16)
Anatomy

Cranium:               Appears normal         Aortic Arch:            Appears normal
Cavum:                 Appears normal         Ductal Arch:            Appears normal
Ventricles:            Appears normal         Diaphragm:              Appears normal
Choroid Plexus:        Appears normal         Stomach:                Appears normal, left
sided
Cerebellum:            Appears normal         Abdomen:                Appears normal
Posterior Fossa:       Appears normal         Abdominal Wall:         Previously seen
Nuchal Fold:           Not applicable (>20    Cord Vessels:           Previously seen
wks GA)
Face:                  Orbits and profile     Kidneys:                Appear normal
previously seen
Lips:                  Appears normal         Bladder:                Appears normal
Thoracic:              Appears normal         Spine:                  Appears normal
Heart:                 Appears normal         Upper Extremities:      Previously seen
(4CH, axis, and
situs)
RVOT:                  Appears normal         Lower Extremities:      Previously seen
LVOT:                  Appears normal

Other:  Fetus appears to be a male. Heels previously visualized. Nasal bone
previously visualized.
Cervix Uterus Adnexa

Cervix
Length:              4  cm.
Impression

SIUP at 27+1 weeks (by previous US)
active singleton fetus
Normal interval anatomy; anatomic survey complete
Normal amniotic fluid volume
Appropriate interval growth with EFW at the 49th %tile
Cervix is long and closed conferring low risk for preterm
delivery
Recommendations

Follow-up ultrasounds as clinically indicated.

## 2018-11-29 ENCOUNTER — Other Ambulatory Visit: Payer: Self-pay | Admitting: *Deleted

## 2018-11-29 DIAGNOSIS — Z20822 Contact with and (suspected) exposure to covid-19: Secondary | ICD-10-CM

## 2018-11-30 LAB — NOVEL CORONAVIRUS, NAA: SARS-CoV-2, NAA: DETECTED — AB

## 2022-05-23 ENCOUNTER — Other Ambulatory Visit: Payer: Self-pay

## 2022-05-23 ENCOUNTER — Ambulatory Visit
Admission: RE | Admit: 2022-05-23 | Discharge: 2022-05-23 | Disposition: A | Discharge status: Home / Self Care | Payer: No Typology Code available for payment source | Source: Ambulatory Visit | Attending: Internal Medicine | Admitting: Internal Medicine

## 2022-05-23 DIAGNOSIS — M25561 Pain in right knee: Secondary | ICD-10-CM

## 2022-07-07 ENCOUNTER — Ambulatory Visit (INDEPENDENT_AMBULATORY_CARE_PROVIDER_SITE_OTHER): Payer: No Typology Code available for payment source | Admitting: Sports Medicine

## 2022-07-07 DIAGNOSIS — M2391 Unspecified internal derangement of right knee: Secondary | ICD-10-CM

## 2022-07-07 DIAGNOSIS — M1711 Unilateral primary osteoarthritis, right knee: Secondary | ICD-10-CM | POA: Insufficient documentation

## 2022-07-07 MED ORDER — TRAMADOL HCL 50 MG PO TABS
50.0000 mg | ORAL_TABLET | Freq: Three times a day (TID) | ORAL | 0 refills | Status: AC | PRN
Start: 2022-07-07 — End: ?

## 2022-07-07 NOTE — Assessment & Plan Note (Signed)
This is a pleasant 36 year old female, 6 months ago she had a fall, she had some subsequent right knee pain anteriorly and laterally, since then she has had multiple more falls. She did get some tramadol from [place] which seem to control her pain, Tylenol and ibuprofen were not helpful. She also had some x-rays done, I did personally review the x-rays and they were negative. On exam she has lateral joint line pain, she has an effusion, she has pain with terminal flexion, she has instability to application of valgus stress. I am concerned about multiple internal derangements including meniscal injuries, cartilage injury and MCL tearing. She wanted me to just call in some tramadol, she also thought that a normal knee x-ray ruled out any damage to the knee, I informed her that x-rays only show bone and other structures that can be injured are not seen. I will give her a short course of tramadol and order an MRI, I would like to see her back to go over MRI results, she can continue her knee brace.

## 2022-07-07 NOTE — Progress Notes (Signed)
    Procedures performed today:    None.  Independent interpretation of notes and tests performed by another provider:   Knee x-ray personally viewed, they are negative.  Brief History, Exam, Impression, and Recommendations:    Internal derangement of knee, right This is a pleasant 37 year old female, 6 months ago she had a fall, she had some subsequent right knee pain anteriorly and laterally, since then she has had multiple more falls. She did get some tramadol from [place] which seem to control her pain, Tylenol and ibuprofen were not helpful. She also had some x-rays done, I did personally review the x-rays and they were negative. On exam she has lateral joint line pain, she has an effusion, she has pain with terminal flexion, she has instability to application of valgus stress. I am concerned about multiple internal derangements including meniscal injuries, cartilage injury and MCL tearing. She wanted me to just call in some tramadol, she also thought that a normal knee x-ray ruled out any damage to the knee, I informed her that x-rays only show bone and other structures that can be injured are not seen. I will give her a short course of tramadol and order an MRI, I would like to see her back to go over MRI results, she can continue her knee brace.    ____________________________________________ Ihor Austin. Benjamin Stain, M.D., ABFM., CAQSM., AME. Primary Care and Sports Medicine [facility]  Adjunct Professor of Family Medicine  [place] [employer] [facility] [employer]  Restaurant manager, fast food

## 2022-07-19 ENCOUNTER — Ambulatory Visit (INDEPENDENT_AMBULATORY_CARE_PROVIDER_SITE_OTHER): Payer: Self-pay

## 2022-07-19 DIAGNOSIS — M2391 Unspecified internal derangement of right knee: Secondary | ICD-10-CM

## 2022-07-29 ENCOUNTER — Ambulatory Visit (INDEPENDENT_AMBULATORY_CARE_PROVIDER_SITE_OTHER): Payer: No Typology Code available for payment source | Admitting: Sports Medicine

## 2022-07-29 ENCOUNTER — Other Ambulatory Visit (INDEPENDENT_AMBULATORY_CARE_PROVIDER_SITE_OTHER): Payer: No Typology Code available for payment source

## 2022-07-29 DIAGNOSIS — M2391 Unspecified internal derangement of right knee: Secondary | ICD-10-CM

## 2022-07-29 NOTE — Assessment & Plan Note (Signed)
This is a very pleasant 37 year old female, approximately 7 months ago she had a fall, she had subsequent right knee pain anteriorly and laterally, she got some tramadol in [place] which seem to control her pain, Tylenol and ibuprofen not helpful, x-rays were normal. She continued to have pain lateral joint line, pain with terminal flexion, my concern was for a meniscal injury, ultimately MRI showed significant osteoarthritis and degenerative meniscal tearing, we added tramadol, we will also do an injection today, return to see me in 4 to 6 weeks. She does need to do some home PT.

## 2022-07-29 NOTE — Progress Notes (Signed)
    Procedures performed today:    Procedure: Real-time Ultrasound Guided injection of the right knee Device: Samsung HS60  Verbal informed consent obtained.  Time-out conducted.  Noted no overlying erythema, induration, or other signs of local infection.  Skin prepped in a sterile fashion.  Local anesthesia: Topical Ethyl chloride.  With sterile technique and under real time ultrasound guidance: Mild effusion noted, 1 cc Kenalog 40, 2 cc lidocaine, 2 cc bupivacaine injected easily Completed without difficulty  Advised to call if fevers/chills, erythema, induration, drainage, or persistent bleeding.  Images permanently stored and available for review in PACS.  Impression: Technically successful ultrasound guided injection.  Independent interpretation of notes and tests performed by another provider:   None.  Brief History, Exam, Impression, and Recommendations:    Internal derangement of knee, right This is a very pleasant 37 year old female, approximately 7 months ago she had a fall, she had subsequent right knee pain anteriorly and laterally, she got some tramadol in [place] which seem to control her pain, Tylenol and ibuprofen not helpful, x-rays were normal. She continued to have pain lateral joint line, pain with terminal flexion, my concern was for a meniscal injury, ultimately MRI showed significant osteoarthritis and degenerative meniscal tearing, we added tramadol, we will also do an injection today, return to see me in 4 to 6 weeks. She does need to do some home PT.   ____________________________________________ Ihor Austin. Benjamin Stain, M.D., ABFM., CAQSM., AME. Primary Care and Sports Medicine [facility]  Adjunct Professor of Family Medicine  [place] of [facility] [employer]  Restaurant manager, fast food

## 2022-09-09 ENCOUNTER — Ambulatory Visit: Payer: No Typology Code available for payment source | Admitting: Sports Medicine

## 2022-09-15 ENCOUNTER — Ambulatory Visit: Payer: No Typology Code available for payment source | Admitting: Sports Medicine

## 2022-09-22 ENCOUNTER — Ambulatory Visit (INDEPENDENT_AMBULATORY_CARE_PROVIDER_SITE_OTHER): Payer: No Typology Code available for payment source | Admitting: Sports Medicine

## 2022-09-22 DIAGNOSIS — M1711 Unilateral primary osteoarthritis, right knee: Secondary | ICD-10-CM

## 2022-09-22 NOTE — Assessment & Plan Note (Signed)
But he Courtney Franco returns, she is a very pleasant 37 year old female, she has had many months of right knee pain, we treated her conservatively which was not sufficiently efficacious, ultimately we obtained an MRI that showed significant patellofemoral osteoarthritis and some degenerative meniscal tearing, I did a knee injection back in late June and she responded dramatically, she is doing really well now, ready to return to work. She will continue home physical therapy, I am happy to refill medications as needed, should she have a recurrence of pain we can certainly try another steroid injection if it has been 3 months. Viscosupplementation also remains an option.

## 2022-09-22 NOTE — Progress Notes (Signed)
    Procedures performed today:    None.  Independent interpretation of notes and tests performed by another provider:   None.  Brief History, Exam, Impression, and Recommendations:    Patellofemoral arthritis of right knee But he Courtney Franco returns, she is a very pleasant 37 year old female, she has had many months of right knee pain, we treated her conservatively which was not sufficiently efficacious, ultimately we obtained an MRI that showed significant patellofemoral osteoarthritis and some degenerative meniscal tearing, I did a knee injection back in late June and she responded dramatically, she is doing really well now, ready to return to work. She will continue home physical therapy, I am happy to refill medications as needed, should she have a recurrence of pain we can certainly try another steroid injection if it has been 3 months. Viscosupplementation also remains an option.    ____________________________________________ Ihor Austin. Benjamin Stain, M.D., ABFM., CAQSM., AME. Primary Care and Sports Medicine [facility]  Adjunct Professor of Family Medicine  [place] [employer] [facility] [employer]  Restaurant manager, fast food

## 2022-11-20 ENCOUNTER — Other Ambulatory Visit (HOSPITAL_COMMUNITY): Payer: Self-pay

## 2022-11-20 ENCOUNTER — Other Ambulatory Visit: Payer: Self-pay

## 2022-11-20 MED ORDER — CETIRIZINE HCL 10 MG PO TABS
10.0000 mg | ORAL_TABLET | Freq: Every day | ORAL | 2 refills | Status: AC
  Filled 2022-11-20 (×2): qty 30, 30d supply, fill #0

## 2022-11-20 MED ORDER — CLOBETASOL PROPIONATE 0.05 % EX SOLN
1.0000 | Freq: Two times a day (BID) | CUTANEOUS | 2 refills | Status: DC
  Filled 2022-11-20 (×2): qty 50, 25d supply, fill #0

## 2022-11-20 MED ORDER — FLUTICASONE PROPIONATE 50 MCG/ACT NA SUSP
1.0000 | Freq: Every day | NASAL | 2 refills | Status: AC
  Filled 2022-11-20: qty 16, 25d supply, fill #0
  Filled 2022-11-20: qty 16, 30d supply, fill #0

## 2022-11-20 MED ORDER — KETOCONAZOLE 2 % EX SHAM
MEDICATED_SHAMPOO | Freq: Every day | CUTANEOUS | 3 refills | Status: AC | PRN
  Filled 2022-11-20: qty 120, 25d supply, fill #0
  Filled 2022-11-20: qty 120, 30d supply, fill #0

## 2022-11-20 MED ORDER — LORATADINE 10 MG PO TABS
10.0000 mg | ORAL_TABLET | Freq: Every day | ORAL | 2 refills | Status: AC
  Filled 2022-11-20 (×2): qty 30, 30d supply, fill #0

## 2023-07-14 ENCOUNTER — Other Ambulatory Visit (HOSPITAL_COMMUNITY): Payer: Self-pay

## 2023-07-14 MED ORDER — CLOBETASOL PROPIONATE 0.05 % EX SOLN
1.0000 | Freq: Two times a day (BID) | CUTANEOUS | 2 refills | Status: AC
  Filled 2023-07-14 – 2023-11-03 (×2): qty 50, 25d supply, fill #0

## 2023-07-14 MED ORDER — LORATADINE 10 MG PO TABS
10.0000 mg | ORAL_TABLET | Freq: Every day | ORAL | 2 refills | Status: AC
  Filled 2023-07-14: qty 90, 90d supply, fill #0

## 2023-07-14 MED ORDER — CETIRIZINE HCL 10 MG PO TABS
10.0000 mg | ORAL_TABLET | Freq: Every day | ORAL | 1 refills | Status: AC
  Filled 2023-07-14: qty 90, 90d supply, fill #0

## 2023-07-14 MED ORDER — KETOCONAZOLE 2 % EX SHAM
1.0000 | MEDICATED_SHAMPOO | Freq: Every day | CUTANEOUS | 3 refills | Status: AC | PRN
  Filled 2023-07-14: qty 120, 24d supply, fill #0

## 2023-07-14 MED ORDER — FLUTICASONE PROPIONATE 50 MCG/ACT NA SUSP
1.0000 | Freq: Every day | NASAL | 2 refills | Status: AC
  Filled 2023-07-14: qty 16, 60d supply, fill #0

## 2023-07-27 ENCOUNTER — Other Ambulatory Visit (HOSPITAL_COMMUNITY): Payer: Self-pay

## 2023-08-06 ENCOUNTER — Other Ambulatory Visit: Payer: Self-pay

## 2023-08-06 ENCOUNTER — Other Ambulatory Visit (HOSPITAL_COMMUNITY): Payer: Self-pay

## 2023-08-06 MED ORDER — CETIRIZINE HCL 10 MG PO TABS
10.0000 mg | ORAL_TABLET | Freq: Every day | ORAL | 1 refills | Status: AC
  Filled 2023-08-06 (×2): qty 100, 100d supply, fill #0

## 2023-08-06 MED ORDER — GENTEAL TEARS 0.1-0.3 % OP SOLN: 1.0000 [drp] | Freq: Three times a day (TID) | OPHTHALMIC | 1 refills | Status: AC | PRN

## 2023-08-06 MED ORDER — KETOCONAZOLE 2 % EX SHAM: 1.0000 | MEDICATED_SHAMPOO | Freq: Every day | CUTANEOUS | 3 refills | Status: AC | PRN

## 2023-08-06 MED ORDER — KETOCONAZOLE 2 % EX SHAM
1.0000 | MEDICATED_SHAMPOO | Freq: Every day | CUTANEOUS | 0 refills | Status: AC | PRN
  Filled 2023-08-06: qty 120, 30d supply, fill #0
  Filled 2023-08-06: qty 120, 24d supply, fill #0

## 2023-08-06 MED ORDER — FLUTICASONE PROPIONATE 50 MCG/ACT NA SUSP
1.0000 | Freq: Every day | NASAL | 2 refills | Status: AC
  Filled 2023-08-06: qty 16, 25d supply, fill #0
  Filled 2023-08-06: qty 16, 60d supply, fill #0

## 2023-08-10 ENCOUNTER — Other Ambulatory Visit (HOSPITAL_COMMUNITY): Payer: Self-pay

## 2023-08-10 MED ORDER — VITAMIN D (ERGOCALCIFEROL) 1.25 MG (50000 UNIT) PO CAPS
50000.0000 [IU] | ORAL_CAPSULE | ORAL | 0 refills | Status: AC
  Filled 2023-08-10 – 2023-08-11 (×2): qty 8, 56d supply, fill #0

## 2023-08-11 ENCOUNTER — Other Ambulatory Visit (HOSPITAL_COMMUNITY): Payer: Self-pay

## 2023-08-11 ENCOUNTER — Other Ambulatory Visit: Payer: Self-pay

## 2023-10-07 ENCOUNTER — Encounter: Payer: Self-pay | Admitting: Sports Medicine

## 2023-10-29 ENCOUNTER — Other Ambulatory Visit: Payer: Self-pay

## 2023-10-29 MED ORDER — KETOCONAZOLE 2 % EX SHAM
1.0000 | MEDICATED_SHAMPOO | Freq: Every day | CUTANEOUS | 1 refills | Status: AC | PRN
  Filled 2023-10-29: qty 120, 24d supply, fill #0

## 2023-10-29 MED ORDER — HYDROCORTISONE 2.5 % EX LOTN
1.0000 | TOPICAL_LOTION | Freq: Two times a day (BID) | CUTANEOUS | 1 refills | Status: AC
  Filled 2023-10-29: qty 59, 30d supply, fill #0

## 2023-10-30 ENCOUNTER — Other Ambulatory Visit: Payer: Self-pay

## 2023-11-03 ENCOUNTER — Other Ambulatory Visit: Payer: Self-pay
